# Patient Record
Sex: Male | Born: 1965 | Race: White | Hispanic: No | Marital: Married | Smoking: Former smoker
Health system: Southern US, Community
[De-identification: ages and names within clinical notes are randomized; demographics above are authoritative.]

## PROBLEM LIST (undated history)

## (undated) DIAGNOSIS — I10 Essential (primary) hypertension: Secondary | ICD-10-CM

## (undated) DIAGNOSIS — K5792 Diverticulitis of intestine, part unspecified, without perforation or abscess without bleeding: Secondary | ICD-10-CM

## (undated) DIAGNOSIS — G4730 Sleep apnea, unspecified: Secondary | ICD-10-CM

## (undated) HISTORY — PX: ANKLE SURGERY: SHX546

## (undated) HISTORY — DX: Diverticulitis of intestine, part unspecified, without perforation or abscess without bleeding: K57.92

## (undated) HISTORY — PX: JOINT REPLACEMENT: SHX530

## (undated) HISTORY — DX: Sleep apnea, unspecified: G47.30

## (undated) HISTORY — PX: HIP SURGERY: SHX245

---

## 2014-11-19 DIAGNOSIS — Z720 Tobacco use: Secondary | ICD-10-CM | POA: Insufficient documentation

## 2014-12-24 DIAGNOSIS — I3139 Other pericardial effusion (noninflammatory): Secondary | ICD-10-CM | POA: Insufficient documentation

## 2014-12-24 DIAGNOSIS — I10 Essential (primary) hypertension: Secondary | ICD-10-CM | POA: Insufficient documentation

## 2015-09-15 DIAGNOSIS — G4730 Sleep apnea, unspecified: Secondary | ICD-10-CM | POA: Insufficient documentation

## 2015-09-22 DIAGNOSIS — Z96649 Presence of unspecified artificial hip joint: Secondary | ICD-10-CM | POA: Insufficient documentation

## 2016-09-26 DIAGNOSIS — S8992XA Unspecified injury of left lower leg, initial encounter: Secondary | ICD-10-CM | POA: Insufficient documentation

## 2016-10-16 ENCOUNTER — Emergency Department
Admission: EM | Admit: 2016-10-16 | Discharge: 2016-10-17 | Disposition: A | Discharge status: Home / Self Care | Payer: BLUE CROSS/BLUE SHIELD | Attending: Emergency Medicine | Admitting: Emergency Medicine

## 2016-10-16 ENCOUNTER — Emergency Department: Payer: BLUE CROSS/BLUE SHIELD

## 2016-10-16 ENCOUNTER — Encounter: Payer: Self-pay | Admitting: Emergency Medicine

## 2016-10-16 DIAGNOSIS — W010XXA Fall on same level from slipping, tripping and stumbling without subsequent striking against object, initial encounter: Secondary | ICD-10-CM | POA: Insufficient documentation

## 2016-10-16 DIAGNOSIS — Y929 Unspecified place or not applicable: Secondary | ICD-10-CM | POA: Diagnosis not present

## 2016-10-16 DIAGNOSIS — I10 Essential (primary) hypertension: Secondary | ICD-10-CM | POA: Insufficient documentation

## 2016-10-16 DIAGNOSIS — S82302A Unspecified fracture of lower end of left tibia, initial encounter for closed fracture: Secondary | ICD-10-CM | POA: Insufficient documentation

## 2016-10-16 DIAGNOSIS — Y999 Unspecified external cause status: Secondary | ICD-10-CM | POA: Insufficient documentation

## 2016-10-16 DIAGNOSIS — S82891A Other fracture of right lower leg, initial encounter for closed fracture: Secondary | ICD-10-CM

## 2016-10-16 DIAGNOSIS — Y939 Activity, unspecified: Secondary | ICD-10-CM | POA: Diagnosis not present

## 2016-10-16 DIAGNOSIS — S99911A Unspecified injury of right ankle, initial encounter: Secondary | ICD-10-CM | POA: Diagnosis present

## 2016-10-16 HISTORY — DX: Essential (primary) hypertension: I10

## 2016-10-16 MED ORDER — HYDROMORPHONE HCL 1 MG/ML IJ SOLN: 1.0000 mg | Freq: Once | INTRAMUSCULAR | Status: DC

## 2016-10-16 MED ORDER — HYDROMORPHONE HCL 1 MG/ML PO LIQD
2.0000 mg | Freq: Once | ORAL | Status: DC
Start: 2016-10-16 — End: 2016-10-16

## 2016-10-16 MED ORDER — HYDROMORPHONE HCL 1 MG/ML IJ SOLN
INTRAMUSCULAR | Status: AC
  Administered 2016-10-16: 1 mg via INTRAVENOUS
  Filled 2016-10-16: qty 2

## 2016-10-16 MED ORDER — HYDROMORPHONE HCL 1 MG/ML IJ SOLN
1.0000 mg | Freq: Once | INTRAMUSCULAR | Status: AC
  Administered 2016-10-16: 1 mg via INTRAVENOUS

## 2016-10-16 MED ORDER — OXYCODONE-ACETAMINOPHEN 7.5-325 MG PO TABS: 1.0000 | ORAL_TABLET | ORAL | 0 refills | Status: DC | PRN

## 2016-10-16 NOTE — ED Notes (Signed)
Pt last had food at 1930 and liquids at 2100.

## 2016-10-16 NOTE — ED Notes (Signed)
Upon patient getting up to use crutches pt placed weight on splinted foot, pt states he felt a pop and splint looks crooked at this time.  Dr. Lamont Snowballifenbark notified.  Family also states that there is a bruising area at back of head.  Pt states he may have hit the back of his head.  Pt denies getting CT of head upon arrival to ER.  EDP to come and evaluate patient.

## 2016-10-16 NOTE — ED Notes (Addendum)
MD Mayford KnifeWilliams and ED tech at bedside for splint, pt medicated prior per MD.
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
## 2016-10-16 NOTE — ED Provider Notes (Signed)
Patient was seen and examined by me, he is neurovascularly intact. I did remove the splint placed prehospital and placed him in a sugar tong as well as posterior splint. X-rays were reviewed by orthopedics. Overall he is cleared for outpatient follow-up   Emily FilbertWilliams, Jonathan E, MD 10/16/16 2300

## 2016-10-16 NOTE — ED Notes (Addendum)
MD Loralie Champagneurrani , Ortho at bedside

## 2016-10-16 NOTE — ED Provider Notes (Signed)
I was called by nursing that the patient redislocated his ankle when attempting to ambulate with crutches after discharge.  I took down his dressing and noted the ankle displaced laterally with obvious medial tenting. I then reduced the patient's ankle fracture dislocation and placed him in a new splint short leg posterior with stirrup and repeat imaging was obtained which confirmed adequate reduction. The patient remains stable for discharge.   Reduction of dislocation Date/Time: 11:57 PM Performed by: Merrily BrittleNeil Keo Schirmer Authorized by: Merrily BrittleNeil Sharnell Knight Consent: Verbal consent obtained. Risks and benefits: risks, benefits and alternatives were discussed Consent given by: patient Required items: required blood products, implants, devices, and special equipment available Time out: Immediately prior to procedure a "time out" was called to verify the correct patient, procedure, equipment, support staff and site/side marked as required.   Vitals: Vital signs were monitored during sedation. Patient tolerance: Patient tolerated the procedure well with no immediate complications. Joint: Right ankle fracture dislocation Reduction technique: Traction countertraction   SPLINT APPLICATION Date/Time: 11:58 PM Authorized by: Merrily BrittleNeil Deo Mehringer Consent: Verbal consent obtained. Risks and benefits: risks, benefits and alternatives were discussed Consent given by: patient Splint applied by: ER technician Location details: Right ankle  Splint type: Short-leg posterior and stirrup  Supplies used: Ortho-Glass  Post-procedure: The splinted body part was neurovascularly unchanged following the procedure. Patient tolerance: Patient tolerated the procedure well with no immediate complications.       Merrily Brittleifenbark, Carmie Lanpher, MD 10/16/16 2358

## 2016-10-17 ENCOUNTER — Emergency Department: Payer: BLUE CROSS/BLUE SHIELD

## 2016-10-17 NOTE — ED Notes (Signed)
Pt discharged to home.  Family member driving.  Discharge instructions reviewed.  Verbalized understanding.  No questions or concerns at this time.  Teach back verified.  Pt in NAD.  No items left in ED.  Pt opted to use home walker instead of crutches due to recent hip replacement.  Pt ambulated well with hospital walker, but denied needing to take one home.

## 2016-10-27 ENCOUNTER — Emergency Department
Admission: EM | Admit: 2016-10-27 | Discharge: 2016-10-27 | Disposition: A | Discharge status: Home / Self Care | Payer: BLUE CROSS/BLUE SHIELD | Attending: Emergency Medicine | Admitting: Emergency Medicine

## 2016-10-27 DIAGNOSIS — G8918 Other acute postprocedural pain: Secondary | ICD-10-CM | POA: Diagnosis not present

## 2016-10-27 DIAGNOSIS — I10 Essential (primary) hypertension: Secondary | ICD-10-CM | POA: Insufficient documentation
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
## 2017-03-22 ENCOUNTER — Ambulatory Visit: Payer: BLUE CROSS/BLUE SHIELD

## 2017-03-22 DIAGNOSIS — G4733 Obstructive sleep apnea (adult) (pediatric): Secondary | ICD-10-CM

## 2017-03-22 NOTE — Progress Notes (Signed)
95 percentile pressure 12.5   95th percentile leak 8.8   apnea index 1.9 /hr  apnea-hypopnea index  2.3 /hr   total days used  >4 hr 178 days  total days used <4 hr 2 days  Total compliance 99 percent  [name] is doing great, no problems or questions at this time.

## 2017-09-20 ENCOUNTER — Ambulatory Visit: Payer: BLUE CROSS/BLUE SHIELD

## 2017-09-20 DIAGNOSIS — G4733 Obstructive sleep apnea (adult) (pediatric): Secondary | ICD-10-CM | POA: Diagnosis not present

## 2017-09-20 NOTE — Progress Notes (Signed)
95 percentile pressure 13.4   95th percentile leak 3.4   apnea index 0.8 /hr  apnea-hypopnea index  0.9 /hr   total days used  >4 hr 180 days  total days used <4 hr 0 days  Total compliance 100 percent  [name] is doing great, no problems or questions

## 2017-10-12 ENCOUNTER — Ambulatory Visit: Payer: BLUE CROSS/BLUE SHIELD | Admitting: Internal Medicine

## 2017-10-12 ENCOUNTER — Encounter: Payer: Self-pay | Admitting: Internal Medicine

## 2017-10-12 VITALS — BP 158/92 | HR 93 | Resp 16 | Ht 79.0 in | Wt 348.0 lb

## 2017-10-12 DIAGNOSIS — G4733 Obstructive sleep apnea (adult) (pediatric): Secondary | ICD-10-CM | POA: Diagnosis not present

## 2017-10-12 DIAGNOSIS — F17219 Nicotine dependence, cigarettes, with unspecified nicotine-induced disorders: Secondary | ICD-10-CM | POA: Diagnosis not present

## 2017-10-12 DIAGNOSIS — I10 Essential (primary) hypertension: Secondary | ICD-10-CM

## 2017-10-12 NOTE — Patient Instructions (Signed)
Coping with Quitting Smoking Quitting smoking is a physical and mental challenge. You will face cravings, withdrawal symptoms, and temptation. Before quitting, work with your health care provider to make a plan that can help you cope. Preparation can help you quit and keep you from giving in. How can I cope with cravings? Cravings usually last for 5-10 minutes. If you get through it, the craving will pass. Consider taking the following actions to help you cope with cravings:  Keep your mouth busy: ? Chew sugar-free gum. ? Suck on hard candies or a straw. ? Brush your teeth.  Keep your hands and body busy: ? Immediately change to a different activity when you feel a craving. ? Squeeze or play with a ball. ? Do an activity or a hobby, like making bead jewelry, practicing needlepoint, or working with wood. ? Mix up your normal routine. ? Take a short exercise break. Go for a quick walk or run up and down stairs. ? Spend time in public places where smoking is not allowed.  Focus on doing something kind or helpful for someone else.  Call a friend or family member to talk during a craving.  Join a support group.  Call a quit line, such as 1-800-QUIT-NOW.  Talk with your health care provider about medicines that might help you cope with cravings and make quitting easier for you.  How can I deal with withdrawal symptoms? Your body may experience negative effects as it tries to get used to not having nicotine in the system. These effects are called withdrawal symptoms. They may include:  Feeling hungrier than normal.  Trouble concentrating.  Irritability.  Trouble sleeping.  Feeling depressed.  Restlessness and agitation.  Craving a cigarette.  To manage withdrawal symptoms:  Avoid places, people, and activities that trigger your cravings.  Remember why you want to quit.  Get plenty of sleep.  Avoid coffee and other caffeinated drinks. These may worsen some of your  symptoms.  How can I handle social situations? Social situations can be difficult when you are quitting smoking, especially in the first few weeks. To manage this, you can:  Avoid parties, bars, and other social situations where people might be smoking.  Avoid alcohol.  Leave right away if you have the urge to smoke.  Explain to your family and friends that you are quitting smoking. Ask for understanding and support.  Plan activities with friends or family where smoking is not an option.  What are some ways I can cope with stress? Wanting to smoke may cause stress, and stress can make you want to smoke. Find ways to manage your stress. Relaxation techniques can help. For example:  Breathe slowly and deeply, in through your nose and out through your mouth.  Listen to soothing, relaxing music.  Talk with a family member or friend about your stress.  Light a candle.  Soak in a bath or take a shower.  Think about a peaceful place.  What are some ways I can prevent weight gain? Be aware that many people gain weight after they quit smoking. However, not everyone does. To keep from gaining weight, have a plan in place before you quit and stick to the plan after you quit. Your plan should include:  Having healthy snacks. When you have a craving, it may help to: ? Eat plain popcorn, crunchy carrots, celery, or other cut vegetables. ? Chew sugar-free gum.  Changing how you eat: ? Eat small portion sizes at meals. ?   Eat 4-6 small meals throughout the day instead of 1-2 large meals a day. ? Be mindful when you eat. Do not watch television or do other things that might distract you as you eat.  Exercising regularly: ? Make time to exercise each day. If you do not have time for a long workout, do short bouts of exercise for 5-10 minutes several times a day. ? Do some form of strengthening exercise, like weight lifting, and some form of aerobic exercise, like running or  swimming.  Drinking plenty of water or other low-calorie or no-calorie drinks. Drink 6-8 glasses of water daily, or as much as instructed by your health care provider.  Summary  Quitting smoking is a physical and mental challenge. You will face cravings, withdrawal symptoms, and temptation to smoke again. Preparation can help you as you go through these challenges.  You can cope with cravings by keeping your mouth busy (such as by chewing gum), keeping your body and hands busy, and making calls to family, friends, or a helpline for people who want to quit smoking.  You can cope with withdrawal symptoms by avoiding places where people smoke, avoiding drinks with caffeine, and getting plenty of rest.  Ask your health care provider about the different ways to prevent weight gain, avoid stress, and handle social situations. This information is not intended to replace advice given to you by your health care provider. Make sure you discuss any questions you have with your health care provider. Document Released: 01/29/2016 Document Revised: 01/29/2016 Document Reviewed: 01/29/2016 Elsevier Interactive Patient Education  2018 [employer] Inc. Sleep Apnea Sleep apnea is a condition that affects breathing. People with sleep apnea have moments during sleep when their breathing pauses briefly or gets shallow. Sleep apnea can cause these symptoms:  Trouble staying asleep.  Sleepiness or tiredness during the day.  Irritability.  Loud snoring.  Morning headaches.  Trouble concentrating.  Forgetting things.  Less interest in sex.  Being sleepy for no reason.  Mood swings.  Personality changes.  Depression.  Waking up a lot during the night to pee (urinate).  Dry mouth.  Sore throat.  Follow these instructions at home:  Make any changes in your routine that your doctor recommends.  Eat a healthy, well-balanced diet.  Take over-the-counter and prescription medicines only as told  by your doctor.  Avoid using alcohol, calming medicines (sedatives), and narcotic medicines.  Take steps to lose weight if you are overweight.  If you were given a machine (device) to use while you sleep, use it only as told by your doctor.  Do not use any tobacco products, such as cigarettes, chewing tobacco, and e-cigarettes. If you need help quitting, ask your doctor.  Keep all follow-up visits as told by your doctor. This is important. Contact a doctor if:  The machine that you were given to use during sleep is uncomfortable or does not seem to be working.  Your symptoms do not get better.  Your symptoms get worse. Get help right away if:  Your chest hurts.  You have trouble breathing in enough air (shortness of breath).  You have an uncomfortable feeling in your back, arms, or stomach.  You have trouble talking.  One side of your body feels weak.  A part of your face is hanging down (drooping). These symptoms may be an emergency. Do not wait to see if the symptoms will go away. Get medical help right away. Call your local emergency services (911 in the [place]  Do not drive yourself to the hospital. This information is not intended to replace advice given to you by your health care provider. Make sure you discuss any questions you have with your health care provider. Document Released: 11/10/2007 Document Revised: 09/27/2015 Document Reviewed: 11/10/2014 Elsevier Interactive Patient Education  [employer]2018 [employer]

## 2017-10-12 NOTE — Progress Notes (Signed)
[facility] [address] [place], [place] [address]  Pulmonary Sleep Medicine   Office Visit Note  Patient Name: Trevor Castillo DOB: 1965-05-14 MRN [id number]  Date of Service: 10/12/2017  Complaints/HPI: Pt is here for one year follow up for OSA.  He reports using his CPAP every night.  He denies mask leak and reports good fit.  He is sleeping approximately 7-8 hours nightly. He denies waking up during the night.  He denies chest pain, headaches, sinus issues. He continues to smoke cigarettes.  He smokes 1 Pack every 3 days.  Pt uses so clean device to clean is cpap.   ROS  General: (-) fever, (-) chills, (-) night sweats, (-) weakness Skin: (-) rashes, (-) itching,. Eyes: (-) visual changes, (-) redness, (-) itching. Nose and Sinuses: (-) nasal stuffiness or itchiness, (-) postnasal drip, (-) nosebleeds, (-) sinus trouble. Mouth and Throat: (-) sore throat, (-) hoarseness. Neck: (-) swollen glands, (-) enlarged thyroid, (-) neck pain. Respiratory: - cough, (-) bloody sputum, - shortness of breath, - wheezing. Cardiovascular: - ankle swelling, (-) chest pain. Lymphatic: (-) lymph node enlargement. Neurologic: (-) numbness, (-) tingling. Psychiatric: (-) anxiety, (-) depression   Current Medication: Outpatient Encounter Medications as of 10/12/2017  Medication Sig  . LORazepam (ATIVAN) 0.5 MG tablet Take by mouth.  . losartan-hydrochlorothiazide (HYZAAR) 100-12.5 MG tablet Take by mouth.  . [DISCONTINUED] oxyCODONE-acetaminophen (PERCOCET) 7.5-325 MG tablet Take 1 tablet by mouth every 4 (four) hours as needed for severe pain. (Patient not taking: Reported on 10/12/2017)   No facility-administered encounter medications on file as of 10/12/2017.     Surgical History: Past Surgical History:  Procedure Laterality Date  . ANKLE SURGERY Right   . HIP SURGERY      Medical History: Past Medical History:  Diagnosis Date  . Hypertension   . Sleep apnea      Family History: Family History  Problem Relation Age of Onset  . Diabetes Mother     Social History: Social History   Socioeconomic History  . Marital status: Married    Spouse name: Not on file  . Number of children: Not on file  . Years of education: Not on file  . Highest education level: Not on file  Occupational History  . Not on file  Social Needs  . Financial resource strain: Not on file  . Food insecurity:    Worry: Not on file    Inability: Not on file  . Transportation needs:    Medical: Not on file    Non-medical: Not on file  Tobacco Use  . Smoking status: Current Every Day Smoker    Types: Cigarettes  . Smokeless tobacco: Never Used  Substance and Sexual Activity  . Alcohol use: Yes  . Drug use: No  . Sexual activity: Not on file  Lifestyle  . Physical activity:    Days per week: Not on file    Minutes per session: Not on file  . Stress: Not on file  Relationships  . Social connections:    Talks on phone: Not on file    Gets together: Not on file    Attends religious service: Not on file    Active member of club or organization: Not on file    Attends meetings of clubs or organizations: Not on file    Relationship status: Not on file  . Intimate partner violence:    Fear of current or ex partner: Not on file  Emotionally abused: Not on file    Physically abused: Not on file    Forced sexual activity: Not on file  Other Topics Concern  . Not on file  Social History Narrative  . Not on file    Vital Signs: Blood pressure (!) 158/92, pulse 93, resp. rate 16, height 6\' 7"  (2.007 m), weight (!) 348 lb (157.9 kg), SpO2 98 %.  Examination: General Appearance: The patient is well-developed, well-nourished, and in no distress. Skin: Gross inspection of skin unremarkable. Head: normocephalic, no gross deformities. Eyes: no gross deformities noted. ENT: ears appear grossly normal no exudates. Neck: Supple. No thyromegaly. No  LAD. Respiratory: Clear to auscultation bilateraly. Cardiovascular: Normal S1 and S2 without murmur or rub. Extremities: No cyanosis. pulses are equal. Neurologic: Alert and oriented. No involuntary movements.  LABS: No results found for this or any previous visit (from the past 2160 hour(s)).  Radiology: No results found.  No results found.  No results found.    Assessment and Plan: Patient Active Problem List   Diagnosis Date Noted  . Left knee injury, initial encounter 09/26/2016  . 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 Essential hypertension 12/24/2014    1. Obstructive sleep apnea of adult Continue cpap compliance.   2. Essential hypertension Elevated at today's visit.  Pt thinks he forgot his medication this morning.  Encouraged medication compliance.     3. Cigarette nicotine dependence with nicotine-induced disorder Again discussed smoking cessation with him.   Smoking cessation counseling: 1. Pt acknowledges the risks of long term smoking, she will try to quite smoking. 2. Options for different medications including nicotine products, chewing gum, patch etc, Wellbutrin and Chantix is discussed 3. Goal and date of compete cessation is discussed 4. Total time spent in smoking cessation is 15 min.   4. Morbid obesity (HCC) Obesity Counseling: Risk Assessment: An assessment of behavioral risk factors was made today and includes lack of exercise sedentary lifestyle, lack of portion control and poor dietary habits.  Risk Modification Advice: She was counseled on portion control guidelines. Restricting daily caloric intake to. . The detrimental long term effects of obesity on her health and ongoing poor compliance was also discussed with the patient.    General Counseling: I have discussed the findings of the evaluation and examination with Trevor Maduroobert.  I have also discussed any further diagnostic evaluation thatmay be needed or ordered today.  Trevor Castillo verbalizes understanding of the findings of todays visit. We also reviewed his medications today and discussed drug interactions and side effects including but not limited excessive drowsiness and altered mental states. We also discussed that there is always a risk not just to him but also people around him. he has been encouraged to call the office with any questions or concerns that should arise related to todays visit.    Time spent: 25 This patient was seen by Blima LedgerAdam Gola Bribiesca AGNP-C in Collaboration with Dr. Freda MunroSaadat Khan as a part of collaborative care agreement.   I have personally obtained a history, examined the patient, evaluated laboratory and imaging results, formulated the assessment and plan and placed orders.    Yevonne PaxSaadat A Khan, MD [facility] Pulmonary and Critical Care Sleep medicine

## 2018-03-28 ENCOUNTER — Ambulatory Visit: Payer: Self-pay

## 2018-04-11 ENCOUNTER — Ambulatory Visit: Payer: BC Managed Care – PPO

## 2018-04-11 DIAGNOSIS — G4733 Obstructive sleep apnea (adult) (pediatric): Secondary | ICD-10-CM

## 2018-04-11 NOTE — Progress Notes (Signed)
95 percentile pressure 14.2   95th percentile leak 8.9   apnea index 1.5 /hr  apnea-hypopnea index  1.8 /hr   total days used  >4 hr 90 days  total days used <4 hr 0 days  Total compliance 100 percent  He does great on cpap no problems or questions at this time

## 2018-10-10 ENCOUNTER — Ambulatory Visit: Payer: BC Managed Care – PPO

## 2018-10-10 DIAGNOSIS — G4733 Obstructive sleep apnea (adult) (pediatric): Secondary | ICD-10-CM | POA: Insufficient documentation

## 2018-10-10 NOTE — Progress Notes (Signed)
95 percentile pressure 13.6   95th percentile leak 7.4   apnea index 1.9 /hr  apnea-hypopnea index  2.2 /hr   total days used  >4 hr 19 days  total days used <4 hr 0 days  Total compliance 100 percent  He is doing great no problems or questions

## 2018-10-15 ENCOUNTER — Other Ambulatory Visit: Payer: Self-pay

## 2018-10-15 ENCOUNTER — Ambulatory Visit: Payer: BC Managed Care – PPO | Admitting: Internal Medicine

## 2018-10-15 ENCOUNTER — Encounter: Payer: Self-pay | Admitting: Internal Medicine

## 2018-10-15 VITALS — BP 144/89 | HR 95 | Resp 16 | Ht 79.0 in | Wt 351.0 lb

## 2018-10-15 DIAGNOSIS — G4733 Obstructive sleep apnea (adult) (pediatric): Secondary | ICD-10-CM

## 2018-10-15 DIAGNOSIS — I10 Essential (primary) hypertension: Secondary | ICD-10-CM

## 2018-10-15 DIAGNOSIS — F17219 Nicotine dependence, cigarettes, with unspecified nicotine-induced disorders: Secondary | ICD-10-CM | POA: Diagnosis not present

## 2018-10-15 NOTE — Progress Notes (Signed)
[facility] [place], [place] [address]  Pulmonary Sleep Medicine   Office Visit Note  Patient Name: Trevor Castillo DOB: [birth year] MRN [id number]  Date of Service: 10/15/2018  Complaints/HPI: Pt is here for follow up on osa.  He reports he is wearing his cpap nightly, and his last compliance report shows 100% compliance with an ahi of 2.2/hr.  He denies snoring, and reports sleeping very well.  He denies any daytime fatigue and overall is doing well.   ROS  General: (-) fever, (-) chills, (-) night sweats, (-) weakness Skin: (-) rashes, (-) itching,. Eyes: (-) visual changes, (-) redness, (-) itching. Nose and Sinuses: (-) nasal stuffiness or itchiness, (-) postnasal drip, (-) nosebleeds, (-) sinus trouble. Mouth and Throat: (-) sore throat, (-) hoarseness. Neck: (-) swollen glands, (-) enlarged thyroid, (-) neck pain. Respiratory: - cough, (-) bloody sputum, - shortness of breath, - wheezing. Cardiovascular: - ankle swelling, (-) chest pain. Lymphatic: (-) lymph node enlargement. Neurologic: (-) numbness, (-) tingling. Psychiatric: (-) anxiety, (-) depression   Current Medication: Outpatient Encounter Medications as of 10/15/2018  Medication Sig  . LORazepam (ATIVAN) 0.5 MG tablet Take by mouth.  . losartan-hydrochlorothiazide (HYZAAR) 100-12.5 MG tablet Take by mouth.  . Misc. Devices MISC by Does not apply route. cpap device  . [DISCONTINUED] losartan-hydrochlorothiazide (HYZAAR) 100-12.5 MG tablet Take by mouth.   No facility-administered encounter medications on file as of 10/15/2018.     Surgical History: Past Surgical History:  Procedure Laterality Date  . ANKLE SURGERY Right   . HIP SURGERY      Medical History: Past Medical History:  Diagnosis Date  . Hypertension   . Sleep apnea     Family History: Family History  Problem Relation Age of Onset  . Diabetes Mother     Social History: Social History   Socioeconomic  History  . Marital status: Married    Spouse name: Not on file  . Number of children: Not on file  . Years of education: Not on file  . Highest education level: Not on file  Occupational History  . Not on file  Social Needs  . Financial resource strain: Not on file  . Food insecurity    Worry: Not on file    Inability: Not on file  . Transportation needs    Medical: Not on file    Non-medical: Not on file  Tobacco Use  . Smoking status: Former Smoker    Types: Cigarettes    Quit date: 05/15/2018    Years since quitting: 0.4  . Smokeless tobacco: Never Used  Substance and Sexual Activity  . Alcohol use: Yes  . Drug use: No  . Sexual activity: Not on file  Lifestyle  . Physical activity    Days per week: Not on file    Minutes per session: Not on file  . Stress: Not on file  Relationships  . Social Herbalist on phone: Not on file    Gets together: Not on file    Attends religious service: Not on file    Active member of club or organization: Not on file    Attends meetings of clubs or organizations: Not on file    Relationship status: Not on file  . Intimate partner violence    Fear of current or ex partner: Not on file    Emotionally abused: Not on file    Physically abused: Not on file  Forced sexual activity: Not on file  Other Topics Concern  . Not on file  Social History Narrative  . Not on file    Vital Signs: Blood pressure (!) 144/89, pulse 95, resp. rate 16, height 6\' 7"  (2.007 m), weight (!) 351 lb (159.2 kg), SpO2 98 %.  Examination: General Appearance: The patient is well-developed, well-nourished, and in no distress. Skin: Gross inspection of skin unremarkable. Head: normocephalic, no gross deformities. Eyes: no gross deformities noted. ENT: ears appear grossly normal no exudates. Neck: Supple. No thyromegaly. No LAD. Respiratory: clear bilaterlly. Cardiovascular: Normal S1 and S2 without murmur or rub. Extremities: No cyanosis.  pulses are equal. Neurologic: Alert and oriented. No involuntary movements.  LABS: No results found for this or any previous visit (from the past 2160 hour(s)).  Radiology: No results found.  No results found.  No results found.    Assessment and Plan: Patient Active Problem List   Diagnosis Date Noted  . Obstructive sleep apnea (adult)   10/10/2018  . Left knee injury, initial encounter 09/26/2016  . 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 Essential hypertension 12/24/2014    1. Obstructive sleep apnea (adult)   Excellent compliance and control of symptoms.  Continue present management and using cpap device.   2. Essential hypertension Slightly elevated today, 144/89, encouraged patient to discuss with pcp.   3. Cigarette nicotine dependence with nicotine-induced disorder Smoking cessation counseling: 1. Pt acknowledges the risks of long term smoking, she will try to quite smoking. 2. Options for different medications including nicotine products, chewing gum, patch etc, Wellbutrin and Chantix is discussed 3. Goal and date of compete cessation is discussed 4. Total time spent in smoking cessation is 15 min.  4. Morbid obesity (HCC) Obesity Counseling: Risk Assessment: An assessment of behavioral risk factors was made today and includes lack of exercise sedentary lifestyle, lack of portion control and poor dietary habits.  Risk Modification Advice: She was counseled on portion control guidelines. Restricting daily caloric intake to. . The detrimental long term effects of obesity on her health and ongoing poor compliance was also discussed with the patient.    General Counseling: I have discussed the findings of the evaluation and examination with Trevor Castillo.  I have also discussed any further diagnostic evaluation thatmay be needed or ordered today. Trevor Castillo verbalizes understanding of the findings of todays visit. We also reviewed his medications today and  discussed drug interactions and side effects including but not limited excessive drowsiness and altered mental states. We also discussed that there is always a risk not just to him but also people around him. he has been encouraged to call the office with any questions or concerns that should arise related to todays visit.    Time spent: 15 This patient was seen by Blima LedgerAdam Goldman Birchall AGNP-C in Collaboration with Dr. Freda MunroSaadat Khan as a part of collaborative care agreement.   I have personally obtained a history, examined the patient, evaluated laboratory and imaging results, formulated the assessment and plan and placed orders.    Yevonne PaxSaadat A Khan, MD [facility] Pulmonary and Critical Care Sleep medicine

## 2019-04-16 ENCOUNTER — Telehealth: Payer: Self-pay

## 2019-04-16 NOTE — Telephone Encounter (Signed)
Confirmed appointment on 04/17/2019 and screened for covid. [facility] 

## 2019-04-17 ENCOUNTER — Ambulatory Visit: Payer: BC Managed Care – PPO

## 2019-04-17 ENCOUNTER — Other Ambulatory Visit: Payer: Self-pay

## 2019-04-17 DIAGNOSIS — G4733 Obstructive sleep apnea (adult) (pediatric): Secondary | ICD-10-CM

## 2019-04-17 NOTE — Progress Notes (Signed)
95 percentile pressure 13.4   95th percentile leak 13.2   apnea index 1.5 /hr  apnea-hypopnea index  1.7 /hr   total days used  >4 hr 88 days  total days used <4 hr 0 days  Total compliance 98 percent  He is doing great no problems or questions

## 2019-05-06 DIAGNOSIS — E7800 Pure hypercholesterolemia, unspecified: Secondary | ICD-10-CM | POA: Diagnosis not present

## 2019-05-06 DIAGNOSIS — G4733 Obstructive sleep apnea (adult) (pediatric): Secondary | ICD-10-CM | POA: Diagnosis not present

## 2019-05-06 DIAGNOSIS — Z0000 Encounter for general adult medical examination without abnormal findings: Secondary | ICD-10-CM | POA: Diagnosis not present

## 2019-05-06 DIAGNOSIS — F411 Generalized anxiety disorder: Secondary | ICD-10-CM | POA: Diagnosis not present

## 2019-05-06 DIAGNOSIS — Z125 Encounter for screening for malignant neoplasm of prostate: Secondary | ICD-10-CM | POA: Diagnosis not present

## 2019-05-06 DIAGNOSIS — I10 Essential (primary) hypertension: Secondary | ICD-10-CM | POA: Diagnosis not present

## 2019-06-13 DIAGNOSIS — L578 Other skin changes due to chronic exposure to nonionizing radiation: Secondary | ICD-10-CM | POA: Diagnosis not present

## 2019-06-13 DIAGNOSIS — L570 Actinic keratosis: Secondary | ICD-10-CM | POA: Diagnosis not present

## 2019-06-13 DIAGNOSIS — B360 Pityriasis versicolor: Secondary | ICD-10-CM | POA: Diagnosis not present

## 2019-06-13 DIAGNOSIS — L853 Xerosis cutis: Secondary | ICD-10-CM | POA: Diagnosis not present

## 2019-09-18 ENCOUNTER — Ambulatory Visit: Payer: BC Managed Care – PPO

## 2019-09-18 ENCOUNTER — Other Ambulatory Visit: Payer: Self-pay

## 2019-09-18 DIAGNOSIS — G4733 Obstructive sleep apnea (adult) (pediatric): Secondary | ICD-10-CM

## 2019-09-18 NOTE — Progress Notes (Signed)
95 percentile pressure 14.1   95th percentile leak 13.8   apnea index 1.4 /hr  apnea-hypopnea index  1.6 /hr   total days used  >4 hr 90 days  total days used <4 hr 0 days  Total compliance 100 percent  He is doing great no problems or questions at this time

## 2019-10-03 ENCOUNTER — Ambulatory Visit
Admission: EM | Admit: 2019-10-03 | Discharge: 2019-10-03 | Disposition: A | Discharge status: Home / Self Care | Payer: BC Managed Care – PPO | Attending: Emergency Medicine | Admitting: Emergency Medicine

## 2019-10-03 ENCOUNTER — Other Ambulatory Visit: Payer: Self-pay

## 2019-10-03 DIAGNOSIS — H6691 Otitis media, unspecified, right ear: Secondary | ICD-10-CM | POA: Diagnosis not present

## 2019-10-03 MED ORDER — AMOXICILLIN 875 MG PO TABS: 875.0000 mg | ORAL_TABLET | Freq: Two times a day (BID) | ORAL | 0 refills | Status: DC

## 2019-10-03 NOTE — ED Provider Notes (Signed)
Trevor Castillo    CSN: [id number] Arrival date & time: 10/03/19  1430      History   Chief Complaint Chief Complaint  Patient presents with  . Otalgia    right    HPI Trevor Castillo is a 54 y.o. male.   Patient presents with right ear pain x2 days.  He denies drainage, fever, chills, sore throat, shortness of breath, vomiting, diarrhea, or other symptoms.  Treatment attempted at home with Aleve.  The history is provided by the patient.    Past Medical History:  Diagnosis Date  . Hypertension   . Sleep apnea     Patient Active Problem List   Diagnosis Date Noted  . Obstructive sleep apnea (adult)   10/10/2018  . Left knee injury, initial encounter 09/26/2016  . 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 Essential hypertension 12/24/2014    Past Surgical History:  Procedure Laterality Date  . ANKLE SURGERY Right   . HIP SURGERY         Home Medications    Prior to Admission medications   Medication Sig Start Date End Date Taking? Authorizing Provider  LORazepam (ATIVAN) 0.5 MG tablet Take by mouth. 07/26/17  Yes [provider]  losartan-hydrochlorothiazide (HYZAAR) 100-12.5 MG tablet Take by mouth. 04/27/18 10/03/19 Yes [provider]  Misc. Devices MISC by Does not apply route. cpap device   Yes [provider]  amoxicillin (AMOXIL) 875 MG tablet Take 1 tablet (875 mg total) by mouth 2 (two) times daily for 7 days. 10/03/19 10/10/19  Mickie Bail, NP    Family History Family History  Problem Relation Age of Onset  . Diabetes Mother     Social History Social History   Tobacco Use  . Smoking status: Former Smoker    Types: Cigarettes    Quit date: 05/15/2018    Years since quitting: 1.3  . Smokeless tobacco: Never Used  Vaping Use  . Vaping Use: Every day  Substance Use Topics  . Alcohol use: Yes  . Drug use: No     Allergies   Patient has no known allergies.   Review of Systems Review  of Systems  Constitutional: Negative for chills and fever.  HENT: Positive for ear pain. Negative for sore throat.   Eyes: Negative for pain and visual disturbance.  Respiratory: Negative for cough and shortness of breath.   Cardiovascular: Negative for chest pain and palpitations.  Gastrointestinal: Negative for abdominal pain, diarrhea and vomiting.  Genitourinary: Negative for dysuria and hematuria.  Musculoskeletal: Negative for arthralgias and back pain.  Skin: Negative for color change and rash.  Neurological: Negative for seizures and syncope.  All other systems reviewed and are negative.    Physical Exam Triage Vital Signs ED Triage Vitals  Enc Vitals Group     BP      Pulse      Resp      Temp      Temp src      SpO2      Weight      Height      Head Circumference      Peak Flow      Pain Score      Pain Loc      Pain Edu?      Excl. in GC?    No data found.  Updated Vital Signs BP 131/90 (BP Location: Left Arm)   Pulse 92   Temp  99.5 F (37.5 C) (Oral)   Resp 18   Ht 6\' 7"  (2.007 m)   Wt (!) 360 lb (163.3 kg)   SpO2 97%   BMI 40.56 kg/m   Visual Acuity Right Eye Distance:   Left Eye Distance:   Bilateral Distance:    Right Eye Near:   Left Eye Near:    Bilateral Near:     Physical Exam Vitals and nursing note reviewed.  Constitutional:      General: He is not in acute distress.    Appearance: He is well-developed.  HENT:     Head: Normocephalic and atraumatic.     Right Ear: Ear canal normal. Tympanic membrane is erythematous.     Left Ear: Tympanic membrane and ear canal normal.     Nose: Nose normal.     Mouth/Throat:     Mouth: Mucous membranes are moist.     Pharynx: Oropharynx is clear.  Eyes:     Conjunctiva/sclera: Conjunctivae normal.  Cardiovascular:     Rate and Rhythm: Normal rate and regular rhythm.     Heart sounds: No murmur heard.   Pulmonary:     Effort: Pulmonary effort is normal. No respiratory distress.      Breath sounds: Normal breath sounds.  Abdominal:     Palpations: Abdomen is soft.     Tenderness: There is no abdominal tenderness. There is no guarding or rebound.  Musculoskeletal:     Cervical back: Neck supple.  Skin:    General: Skin is warm and dry.     Findings: No rash.  Neurological:     General: No focal deficit present.     Mental Status: He is alert and oriented to person, place, and time.     Gait: Gait normal.  Psychiatric:        Mood and Affect: Mood normal.        Behavior: Behavior normal.      UC Treatments / Results  Labs (all labs ordered are listed, but only abnormal results are displayed) Labs Reviewed - No data to display  EKG   Radiology No results found.  Procedures Procedures (including critical care time)  Medications Ordered in UC Medications - No data to display  Initial Impression / Assessment and Plan / UC Course  I have reviewed the triage vital signs and the nursing notes.  Pertinent labs & imaging results that were available during my care of the patient were reviewed by me and considered in my medical decision making (see chart for details).   Right otitis media.  Treating with amoxicillin.  Instructed patient to continue Tylenol or ibuprofen as needed for discomfort.  Instructed him to follow-up with his PCP if his symptoms are not improving.  Patient agrees to plan of care.   Final Clinical Impressions(s) / UC Diagnoses   Final diagnoses:  Right otitis media, unspecified otitis media type     Discharge Instructions     Take the amoxicillin as directed.    Follow up with your primary care provider if your symptoms are not improving.       ED Prescriptions    Medication Sig Dispense Auth. Provider   amoxicillin (AMOXIL) 875 MG tablet Take 1 tablet (875 mg total) by mouth 2 (two) times daily for 7 days. 14 tablet , NP     PDMP not reviewed this encounter.   Mickie Bail, NP 10/03/19 1458

## 2019-10-03 NOTE — ED Triage Notes (Signed)
Patient complains of right ear pain that started yesterday. Patient states that the pain was worse last night and woke him up. Concerned for swimmer's ear.

## 2019-10-03 NOTE — Discharge Instructions (Signed)
Take the amoxicillin as directed.  Follow up with your primary care provider if your symptoms are not improving.   ° ° °

## 2019-10-09 ENCOUNTER — Other Ambulatory Visit: Payer: Self-pay

## 2019-10-09 ENCOUNTER — Ambulatory Visit
Admission: EM | Admit: 2019-10-09 | Discharge: 2019-10-09 | Disposition: A | Discharge status: Home / Self Care | Payer: BC Managed Care – PPO

## 2019-10-09 DIAGNOSIS — H6591 Unspecified nonsuppurative otitis media, right ear: Secondary | ICD-10-CM

## 2019-10-09 DIAGNOSIS — H9201 Otalgia, right ear: Secondary | ICD-10-CM

## 2019-10-09 DIAGNOSIS — H60331 Swimmer's ear, right ear: Secondary | ICD-10-CM

## 2019-10-09 MED ORDER — AMOXICILLIN-POT CLAVULANATE 875-125 MG PO TABS: 1.0000 | ORAL_TABLET | Freq: Two times a day (BID) | ORAL | 0 refills | Status: AC

## 2019-10-09 MED ORDER — CIPROFLOXACIN-DEXAMETHASONE 0.3-0.1 % OT SUSP: 4.0000 [drp] | Freq: Two times a day (BID) | OTIC | 0 refills | Status: AC

## 2019-10-09 NOTE — ED Triage Notes (Signed)
Pt was eval'ed and tx for R ear infection at [facility] on 08/19.  Was on amoxicillin and improving.  Went swimming on Sat 08/21 and has had intermittent increased pain in R ear.

## 2019-10-09 NOTE — Discharge Instructions (Signed)
I have sent in Augmentin to your pharmacy  Take the antibiotic twice a day for 10 days  I have also sent in ciprodex drops to your pharmacy. 4 drops to the right ear twice a day for 7 days  Follow up with the ER for high fever, inconsolable crying, trouble swallowing, trouble breathing, decreased urine output, other concerning symptoms

## 2019-10-09 NOTE — ED Provider Notes (Signed)
[facility] CARE CENTER   [id number] 10/09/19 Arrival Time: 1003  CC: EAR PAIN  SUBJECTIVE: History from: patient.  Trevor Castillo is a 54 y.o. male who presents with right ear pain for the past week. Was seen in this UC on 10/03/19 and treated with amoxicillin for ear infection. Reports that he was feeling better, went swimming x 3 days ago and pain has returned.Patient states the pain is constant and achy in character. Symptoms are made worse with lying down. Reports similar symptoms in the past. Denies fever, chills, fatigue, sinus pain, rhinorrhea, ear discharge, sore throat, SOB, wheezing, chest pain, nausea, changes in bowel or bladder habits.    ROS: As per HPI.  All other pertinent ROS negative.     Past Medical History:  Diagnosis Date  . Hypertension   . Sleep apnea    Past Surgical History:  Procedure Laterality Date  . ANKLE SURGERY Right   . HIP SURGERY    . JOINT REPLACEMENT     No Known Allergies No current facility-administered medications on file prior to encounter.   Current Outpatient Medications on File Prior to Encounter  Medication Sig Dispense Refill  . LORazepam (ATIVAN) 0.5 MG tablet Take by mouth.    . losartan-hydrochlorothiazide (HYZAAR) 100-12.5 MG tablet Take by mouth.    . Misc. Devices MISC by Does not apply route. cpap device    . naproxen sodium (ALEVE) 220 MG tablet Take 220 mg by mouth.     Social History   Socioeconomic History  . Marital status: Married    Spouse name: Not on file  . Number of children: Not on file  . Years of education: Not on file  . Highest education level: Not on file  Occupational History  . Not on file  Tobacco Use  . Smoking status: Former Smoker    Types: Cigarettes    Quit date: 05/15/2018    Years since quitting: 1.4  . Smokeless tobacco: Never Used  Vaping Use  . Vaping Use: Every day  . Substances: Nicotine, Flavoring  Substance and Sexual Activity  . Alcohol use: Yes    Alcohol/week: 14.0  standard drinks    Types: 14 Shots of liquor per week  . Drug use: No  . Sexual activity: Not on file  Other Topics Concern  . Not on file  Social History Narrative  . Not on file   Social Determinants of Health   Financial Resource Strain:   . Difficulty of Paying Living Expenses: Not on file  Food Insecurity:   . Worried About Programme researcher, broadcasting/film/video in the Last Year: Not on file  . Ran Out of Food in the Last Year: Not on file  Transportation Needs:   . Lack of Transportation (Medical): Not on file  . Lack of Transportation (Non-Medical): Not on file  Physical Activity:   . Days of Exercise per Week: Not on file  . Minutes of Exercise per Session: Not on file  Stress:   . Feeling of Stress : Not on file  Social Connections:   . Frequency of Communication with Friends and Family: Not on file  . Frequency of Social Gatherings with Friends and Family: Not on file  . Attends Religious Services: Not on file  . Active Member of Clubs or Organizations: Not on file  . Attends Banker Meetings: Not on file  . Marital Status: Not on file  Intimate Partner Violence:   . Fear of Current or Ex-Partner:  Not on file  . Emotionally Abused: Not on file  . Physically Abused: Not on file  . Sexually Abused: Not on file   Family History  Problem Relation Age of Onset  . Diabetes Mother     OBJECTIVE:  Vitals:   10/09/19 1009  BP: (!) 141/93  Pulse: 92  Resp: 18  Temp: 98.8 F (37.1 C)  TempSrc: Oral  SpO2: 98%     General appearance: alert; appears fatigued HEENT: Ears: L EACs clear, R EAC with erythema, swelling,  Tenderness, L TM pearly gray with visible cone of light, without erythema, R TM erythematous, bulging, with effusion; Eyes: PERRL, EOMI grossly; Sinuses nontender to palpation; Nose: clear rhinorrhea; Throat: oropharynx mildly erythematous, tonsils 1+ without white tonsillar exudates, uvula midline Neck: supple without LAD Lungs: unlabored respirations,  symmetrical air entry; cough: absent; no respiratory distress Heart: regular rate and rhythm.  Radial pulses 2+ symmetrical bilaterally Skin: warm and dry Psychological: alert and cooperative; normal mood and affect  Imaging: No results found.   ASSESSMENT & PLAN:  1. Right otitis media with effusion   2. Otalgia, right   3. Acute swimmer's ear of right side     Meds ordered this encounter  Medications  . amoxicillin-clavulanate (AUGMENTIN) 875-125 MG tablet    Sig: Take 1 tablet by mouth 2 (two) times daily for 10 days.    Dispense:  20 tablet    Refill:  0    Order Specific Question:   Supervising Provider    Answer:   Merrilee Jansky [id number]  . ciprofloxacin-dexamethasone (CIPRODEX) OTIC suspension    Sig: Place 4 drops into the right ear 2 (two) times daily for 7 days.    Dispense:  7.5 mL    Refill:  0    Order Specific Question:   Supervising Provider    Answer:   Merrilee Jansky [[id number]]    Rest and drink plenty of fluids Prescribed augmentin 875 for 7 days Prescribed ciprodex ear drops Take medications as directed and to completion Continue to use OTC ibuprofen and/ or tylenol as needed for pain control Follow up with PCP if symptoms persists Return here or go to the ER if you have any new or worsening symptoms   Reviewed expectations re: course of current medical issues. Questions answered. Outlined signs and symptoms indicating need for more acute intervention. Patient verbalized understanding. After Visit Summary given.         Moshe Cipro, NP 10/09/19 1027

## 2019-10-10 ENCOUNTER — Telehealth: Payer: Self-pay

## 2019-10-10 NOTE — Telephone Encounter (Signed)
Confirmed appt for 10/14/19  

## 2019-10-14 ENCOUNTER — Other Ambulatory Visit: Payer: Self-pay

## 2019-10-14 ENCOUNTER — Encounter: Payer: Self-pay | Admitting: Internal Medicine

## 2019-10-14 ENCOUNTER — Ambulatory Visit: Payer: BC Managed Care – PPO | Admitting: Internal Medicine

## 2019-10-14 VITALS — BP 152/82 | HR 98 | Temp 98.2°F | Resp 16 | Ht 79.0 in | Wt 362.0 lb

## 2019-10-14 DIAGNOSIS — I10 Essential (primary) hypertension: Secondary | ICD-10-CM

## 2019-10-14 DIAGNOSIS — Z9989 Dependence on other enabling machines and devices: Secondary | ICD-10-CM

## 2019-10-14 DIAGNOSIS — G4733 Obstructive sleep apnea (adult) (pediatric): Secondary | ICD-10-CM

## 2019-10-14 DIAGNOSIS — F17219 Nicotine dependence, cigarettes, with unspecified nicotine-induced disorders: Secondary | ICD-10-CM | POA: Diagnosis not present

## 2019-10-14 NOTE — Progress Notes (Signed)
[facility] [address] [place], [place] [address]  Pulmonary Sleep Medicine   Office Visit Note  Patient Name: Trevor Castillo DOB: December 15, 1965 MRN [id number]  Date of Service: 10/14/2019  Complaints/HPI: Pt is here for yearly follow up on osa.  He reports wearing cpap nightly. Pt reports good compliance with CPAP therapy. Cleaning machine by hand, and changing filters and tubing as directed. Denies headaches, sinus issues, palpitations, or hemoptysis.  Compliance reports shows ahi of 1.6, with 100 % compliance.    ROS  General: (-) fever, (-) chills, (-) night sweats, (-) weakness Skin: (-) rashes, (-) itching,. Eyes: (-) visual changes, (-) redness, (-) itching. Nose and Sinuses: (-) nasal stuffiness or itchiness, (-) postnasal drip, (-) nosebleeds, (-) sinus trouble. Mouth and Throat: (-) sore throat, (-) hoarseness. Neck: (-) swollen glands, (-) enlarged thyroid, (-) neck pain. Respiratory: - cough, (-) bloody sputum, - shortness of breath, - wheezing. Cardiovascular: - ankle swelling, (-) chest pain. Lymphatic: (-) lymph node enlargement. Neurologic: (-) numbness, (-) tingling. Psychiatric: (-) anxiety, (-) depression   Current Medication: Outpatient Encounter Medications as of 10/14/2019  Medication Sig  . amoxicillin-clavulanate (AUGMENTIN) 875-125 MG tablet Take 1 tablet by mouth 2 (two) times daily for 10 days.  . ciprofloxacin-dexamethasone (CIPRODEX) OTIC suspension Place 4 drops into the right ear 2 (two) times daily for 7 days.  Marland Kitchen LORazepam (ATIVAN) 0.5 MG tablet Take by mouth.  . Misc. Devices MISC by Does not apply route. cpap device  . naproxen sodium (ALEVE) 220 MG tablet Take 220 mg by mouth.  . losartan-hydrochlorothiazide (HYZAAR) 100-12.5 MG tablet Take by mouth.   No facility-administered encounter medications on file as of 10/14/2019.    Surgical History: Past Surgical History:  Procedure Laterality Date  . ANKLE SURGERY Right   . HIP  SURGERY    . JOINT REPLACEMENT      Medical History: Past Medical History:  Diagnosis Date  . Hypertension   . Sleep apnea     Family History: Family History  Problem Relation Age of Onset  . Diabetes Mother     Social History: Social History   Socioeconomic History  . Marital status: Married    Spouse name: Not on file  . Number of children: Not on file  . Years of education: Not on file  . Highest education level: Not on file  Occupational History  . Not on file  Tobacco Use  . Smoking status: Former Smoker    Types: Cigarettes    Quit date: 05/15/2018    Years since quitting: 1.4  . Smokeless tobacco: Former Clinical biochemist  . Vaping Use: Every day  . Substances: Nicotine, Flavoring  Substance and Sexual Activity  . Alcohol use: Yes    Alcohol/week: 14.0 standard drinks    Types: 14 Shots of liquor per week  . Drug use: No  . Sexual activity: Not on file  Other Topics Concern  . Not on file  Social History Narrative  . Not on file   Social Determinants of Health   Financial Resource Strain:   . Difficulty of Paying Living Expenses: Not on file  Food Insecurity:   . Worried About Programme researcher, broadcasting/film/video in the Last Year: Not on file  . Ran Out of Food in the Last Year: Not on file  Transportation Needs:   . Lack of Transportation (Medical): Not on file  . Lack of Transportation (Non-Medical): Not on file  Physical Activity:   .  Days of Exercise per Week: Not on file  . Minutes of Exercise per Session: Not on file  Stress:   . Feeling of Stress : Not on file  Social Connections:   . Frequency of Communication with Friends and Family: Not on file  . Frequency of Social Gatherings with Friends and Family: Not on file  . Attends Religious Services: Not on file  . Active Member of Clubs or Organizations: Not on file  . Attends Banker Meetings: Not on file  . Marital Status: Not on file  Intimate Partner Violence:   . Fear of Current or  Ex-Partner: Not on file  . Emotionally Abused: Not on file  . Physically Abused: Not on file  . Sexually Abused: Not on file    Vital Signs: Blood pressure (!) 150/96, pulse 98, temperature 98.2 F (36.8 C), resp. rate 16, height 6\' 7"  (2.007 m), weight (!) 362 lb (164.2 kg), SpO2 93 %.  Examination: General Appearance: The patient is well-developed, well-nourished, and in no distress. Skin: Gross inspection of skin unremarkable. Head: normocephalic, no gross deformities. Eyes: no gross deformities noted. ENT: ears appear grossly normal no exudates. Neck: Supple. No thyromegaly. No LAD. Respiratory: clear bilaterally. Cardiovascular: Normal S1 and S2 without murmur or rub. Extremities: No cyanosis. pulses are equal. Neurologic: Alert and oriented. No involuntary movements.  LABS: No results found for this or any previous visit (from the past 2160 hour(s)).  Radiology: No results found.  No results found.  No results found.    Assessment and Plan: Patient Active Problem List   Diagnosis Date Noted  . Obstructive sleep apnea (adult)   10/10/2018  . Left knee injury, initial encounter 09/26/2016  . 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 Essential hypertension 12/24/2014    1. OSA on CPAP Continue to wear cpap nightly.  Good benefits at this time.   2. Essential hypertension Continue current mgmt.   3. Cigarette nicotine dependence with nicotine-induced disorder Quit smoking over a year ago, is using vape pen.   General Counseling: I have discussed the findings of the evaluation and examination with 13/10/2014.  I have also discussed any further diagnostic evaluation thatmay be needed or ordered today. Leodan verbalizes understanding of the findings of todays visit. We also reviewed his medications today and discussed drug interactions and side effects including but not limited excessive drowsiness and altered mental states. We also discussed that  there is always a risk not just to him but also people around him. he has been encouraged to call the office with any questions or concerns that should arise related to todays visit.  No orders of the defined types were placed in this encounter.    Time spent: 25 This patient was seen by Molly Maduro AGNP-C in Collaboration with Dr. Blima Ledger as a part of collaborative care agreement.   I have personally obtained a history, examined the patient, evaluated laboratory and imaging results, formulated the assessment and plan and placed orders.    Freda Munro, MD [facility] Pulmonary and Critical Care Sleep medicine

## 2020-08-05 ENCOUNTER — Ambulatory Visit: Payer: BC Managed Care – PPO

## 2020-08-12 ENCOUNTER — Ambulatory Visit: Payer: BC Managed Care – PPO

## 2020-08-12 ENCOUNTER — Other Ambulatory Visit: Payer: Self-pay

## 2020-08-12 DIAGNOSIS — G4733 Obstructive sleep apnea (adult) (pediatric): Secondary | ICD-10-CM

## 2020-08-12 NOTE — Progress Notes (Signed)
95 percentile pressure 13.8   95th percentile leak 17.3   apnea index 1.3 /hr  apnea-hypopnea index  1.5 /hr   total days used  >4 hr 30 days  total days used <4 hr 0 days  Total compliance 100 percent   He is using back up cpap as other one is making noise. He is eligible for new cpap Nov 2022 under insurance

## 2020-08-13 DIAGNOSIS — E782 Mixed hyperlipidemia: Secondary | ICD-10-CM | POA: Diagnosis not present

## 2020-08-13 DIAGNOSIS — Z125 Encounter for screening for malignant neoplasm of prostate: Secondary | ICD-10-CM | POA: Diagnosis not present

## 2020-08-13 DIAGNOSIS — Z0000 Encounter for general adult medical examination without abnormal findings: Secondary | ICD-10-CM | POA: Diagnosis not present

## 2020-08-13 DIAGNOSIS — I10 Essential (primary) hypertension: Secondary | ICD-10-CM | POA: Diagnosis not present

## 2020-10-13 ENCOUNTER — Ambulatory Visit: Payer: BC Managed Care – PPO | Admitting: Nurse Practitioner

## 2020-10-13 ENCOUNTER — Encounter: Payer: Self-pay | Admitting: Nurse Practitioner

## 2020-10-13 ENCOUNTER — Other Ambulatory Visit: Payer: Self-pay

## 2020-10-13 VITALS — BP 140/86 | HR 99 | Temp 98.0°F | Resp 16 | Ht 79.0 in | Wt 372.4 lb

## 2020-10-13 DIAGNOSIS — I10 Essential (primary) hypertension: Secondary | ICD-10-CM | POA: Diagnosis not present

## 2020-10-13 DIAGNOSIS — Z6841 Body Mass Index (BMI) 40.0 and over, adult: Secondary | ICD-10-CM

## 2020-10-13 DIAGNOSIS — G4733 Obstructive sleep apnea (adult) (pediatric): Secondary | ICD-10-CM

## 2020-10-13 DIAGNOSIS — Z9989 Dependence on other enabling machines and devices: Secondary | ICD-10-CM

## 2020-10-13 NOTE — Progress Notes (Signed)
[facility] [address] [place], [place] [address]  Internal MEDICINE  Office Visit Note  Patient Name: Trevor Castillo  [id number]  [id number]  Date of Service: 10/13/2020  Chief Complaint  Patient presents with   Follow-up    Cpap download    Sleep Apnea    HPI Gilles presents for a follow up for sleep apnea and CPAP use. Most recent CPAP download was on 08/12/20. His CPAP download showed 100 compliance with CPAP use. AHI is 1.5/hr. He is eligible for a new CPAP in November this year under his insurance. He is using a back up device right now because the other one is making noise. He is requesting for the new order for the CPAP machine be placed now so he will be able to get it in November and will not have to wait for it. He is sleeping well, not waking up at night and denies any daytime sleepiness or fatigue.     Current Medication: Outpatient Encounter Medications as of 10/13/2020  Medication Sig   LORazepam (ATIVAN) 0.5 MG tablet Take by mouth.   Misc. Devices MISC by Does not apply route. cpap device   naproxen sodium (ALEVE) 220 MG tablet Take 220 mg by mouth.   losartan-hydrochlorothiazide (HYZAAR) 100-12.5 MG tablet Take by mouth.   No facility-administered encounter medications on file as of 10/13/2020.    Surgical History: Past Surgical History:  Procedure Laterality Date   ANKLE SURGERY Right    HIP SURGERY     JOINT REPLACEMENT      Medical History: Past Medical History:  Diagnosis Date   Hypertension    Sleep apnea     Family History: Family History  Problem Relation Age of Onset   Diabetes Mother     Social History   Socioeconomic History   Marital status: Married    Spouse name: Not on file   Number of children: Not on file   Years of education: Not on file   Highest education level: Not on file  Occupational History   Not on file  Tobacco Use   Smoking status: Former    Types: Cigarettes    Quit date: 05/15/2018    Years  since quitting: 2.4   Smokeless tobacco: Former  Building services engineer Use: Every day   Substances: Nicotine, Flavoring  Substance and Sexual Activity   Alcohol use: Yes    Alcohol/week: 14.0 standard drinks    Types: 14 Shots of liquor per week   Drug use: No   Sexual activity: Not on file  Other Topics Concern   Not on file  Social History Narrative   Not on file   Social Determinants of Health   Financial Resource Strain: Not on file  Food Insecurity: Not on file  Transportation Needs: Not on file  Physical Activity: Not on file  Stress: Not on file  Social Connections: Not on file  Intimate Partner Violence: Not on file      Review of Systems  Constitutional:  Negative for chills, fatigue and unexpected weight change.  HENT:  Negative for congestion, rhinorrhea, sneezing and sore throat.   Eyes:  Negative for redness.  Respiratory:  Negative for cough, chest tightness and shortness of breath.   Cardiovascular:  Negative for chest pain and palpitations.  Gastrointestinal:  Negative for abdominal pain, constipation, diarrhea, nausea and vomiting.  Genitourinary:  Negative for dysuria and frequency.  Musculoskeletal:  Negative for arthralgias, back pain, joint swelling  and neck pain.  Skin:  Negative for rash.  Neurological: Negative.  Negative for tremors and numbness.  Hematological:  Negative for adenopathy. Does not bruise/bleed easily.  Psychiatric/Behavioral:  Negative for behavioral problems (Depression), sleep disturbance and suicidal ideas. The patient is not nervous/anxious.    Vital Signs: BP 140/86 Comment: 148/84  Pulse 99 Comment: 120  Temp 98 F (36.7 C)   Resp 16   Ht 6\' 7"  (2.007 m)   Wt (!) 372 lb 6.4 oz (168.9 kg)   SpO2 98%   BMI 41.95 kg/m    Physical Exam Vitals reviewed.  Constitutional:      General: He is not in acute distress.    Appearance: Normal appearance. He is obese. He is not ill-appearing.  HENT:     Head: Normocephalic  and atraumatic.  Eyes:     Extraocular Movements: Extraocular movements intact.     Pupils: Pupils are equal, round, and reactive to light.  Cardiovascular:     Rate and Rhythm: Normal rate and regular rhythm.     Heart sounds: Normal heart sounds. No murmur heard. Pulmonary:     Effort: Pulmonary effort is normal. No respiratory distress.     Breath sounds: Normal breath sounds. No wheezing.  Neurological:     Mental Status: He is alert and oriented to person, place, and time.     Cranial Nerves: No cranial nerve deficit.     Coordination: Coordination normal.     Gait: Gait normal.  Psychiatric:        Mood and Affect: Mood normal.        Behavior: Behavior normal.     Assessment/Plan: 1. OSA on CPAP Eligible for new CPAP in November 2022. 100% compliance, continue as instructed.  - For home use only DME continuous positive airway pressure (CPAP)  2. Essential hypertension Blood pressure slightly elevated, encouraged to discuss with PCP.   3. Class 3 severe obesity due to excess calories with serious comorbidity and body mass index (BMI) of 40.0 to 44.9 in adult [facility]) Patient is aware that weight loss will improve his sleep apnea and hypertension. He is working on diet and lifestyle modifications. Encouraged to discuss with his PCP further if needed.    General Counseling: yeudiel mateo understanding of the findings of todays visit and agrees with plan of treatment. I have discussed any further diagnostic evaluation that may be needed or ordered today. We also reviewed his medications today. he has been encouraged to call the office with any questions or concerns that should arise related to todays visit.    Orders Placed This Encounter  Procedures   For home use only DME continuous positive airway pressure (CPAP)     No orders of the defined types were placed in this encounter.   Return in about 1 year (around 10/13/2021) for F/U, pulmonary/sleep with [facility]SK or  Briane Birden.   Total time spent:20 Minutes Time spent includes review of chart, medications, test results, and follow up plan with the patient.   [place] Controlled Substance Database was reviewed by me.  This patient was seen by 10/15/2021, FNP-C in collaboration with Dr. Sallyanne Kuster as a part of collaborative care agreement.   Emmali Karow R. Beverely Risen, MSN, FNP-C Internal medicine

## 2020-12-10 ENCOUNTER — Telehealth: Payer: Self-pay

## 2020-12-10 NOTE — Telephone Encounter (Signed)
Patient called to have an order for a new cpap machine put in. I advised I would reach out to his provider and have her put that order in.   Sent a message to Sallyanne Kuster, NP to put an order in for CPAP.

## 2020-12-11 ENCOUNTER — Other Ambulatory Visit: Payer: Self-pay | Admitting: Nurse Practitioner

## 2020-12-11 ENCOUNTER — Telehealth: Payer: Self-pay

## 2020-12-11 DIAGNOSIS — G4733 Obstructive sleep apnea (adult) (pediatric): Secondary | ICD-10-CM

## 2020-12-11 NOTE — Telephone Encounter (Signed)
Patients CPAP order was sent over to [facility]. Waiting for processing. Patient advised.

## 2020-12-18 ENCOUNTER — Other Ambulatory Visit: Payer: Self-pay | Admitting: Nurse Practitioner

## 2021-02-17 ENCOUNTER — Telehealth: Payer: Self-pay

## 2021-02-17 NOTE — Telephone Encounter (Signed)
Order for OSA supplies signed by provider and given to Kelly with [facility]. °

## 2021-04-18 ENCOUNTER — Other Ambulatory Visit: Payer: Self-pay

## 2021-04-18 ENCOUNTER — Encounter: Payer: Self-pay | Admitting: Emergency Medicine

## 2021-04-18 ENCOUNTER — Emergency Department: Payer: BC Managed Care – PPO

## 2021-04-18 ENCOUNTER — Emergency Department
Admission: EM | Admit: 2021-04-18 | Discharge: 2021-04-18 | Disposition: A | Discharge status: Home / Self Care | Payer: BC Managed Care – PPO | Attending: Emergency Medicine | Admitting: Emergency Medicine

## 2021-04-18 DIAGNOSIS — K5730 Diverticulosis of large intestine without perforation or abscess without bleeding: Secondary | ICD-10-CM | POA: Diagnosis not present

## 2021-04-18 DIAGNOSIS — K5792 Diverticulitis of intestine, part unspecified, without perforation or abscess without bleeding: Secondary | ICD-10-CM | POA: Diagnosis not present

## 2021-04-18 DIAGNOSIS — R1032 Left lower quadrant pain: Secondary | ICD-10-CM | POA: Diagnosis not present

## 2021-04-18 DIAGNOSIS — I10 Essential (primary) hypertension: Secondary | ICD-10-CM | POA: Insufficient documentation

## 2021-04-18 DIAGNOSIS — K2289 Other specified disease of esophagus: Secondary | ICD-10-CM | POA: Diagnosis not present

## 2021-04-18 LAB — CBC WITH DIFFERENTIAL/PLATELET
Abs Immature Granulocytes: 0.02 10*3/uL (ref 0.00–0.07)
Basophils Absolute: 0.1 10*3/uL (ref 0.0–0.1)
Basophils Relative: 1 %
Eosinophils Absolute: 0.2 10*3/uL (ref 0.0–0.5)
Eosinophils Relative: 2 %
HCT: 42.4 % (ref 39.0–52.0)
Hemoglobin: 14.7 g/dL (ref 13.0–17.0)
Immature Granulocytes: 0 %
Lymphocytes Relative: 9 %
Lymphs Abs: 0.8 10*3/uL (ref 0.7–4.0)
MCH: 35.8 pg — ABNORMAL HIGH (ref 26.0–34.0)
MCHC: 34.7 g/dL (ref 30.0–36.0)
MCV: 103.2 fL — ABNORMAL HIGH (ref 80.0–100.0)
Monocytes Absolute: 1.3 10*3/uL — ABNORMAL HIGH (ref 0.1–1.0)
Monocytes Relative: 14 %
Neutro Abs: 6.5 10*3/uL (ref 1.7–7.7)
Neutrophils Relative %: 74 %
Platelets: 177 10*3/uL (ref 150–400)
RBC: 4.11 MIL/uL — ABNORMAL LOW (ref 4.22–5.81)
RDW: 12.7 % (ref 11.5–15.5)
WBC: 8.9 10*3/uL (ref 4.0–10.5)
nRBC: 0 % (ref 0.0–0.2)

## 2021-04-18 LAB — HEPATIC FUNCTION PANEL
ALT: 41 U/L (ref 0–44)
AST: 23 U/L (ref 15–41)
Albumin: 3.9 g/dL (ref 3.5–5.0)
Alkaline Phosphatase: 53 U/L (ref 38–126)
Bilirubin, Direct: 0.2 mg/dL (ref 0.0–0.2)
Indirect Bilirubin: 0.8 mg/dL (ref 0.3–0.9)
Total Bilirubin: 1 mg/dL (ref 0.3–1.2)
Total Protein: 7 g/dL (ref 6.5–8.1)

## 2021-04-18 LAB — URINALYSIS, ROUTINE W REFLEX MICROSCOPIC
Bilirubin Urine: NEGATIVE
Glucose, UA: NEGATIVE mg/dL
Hgb urine dipstick: NEGATIVE
Ketones, ur: NEGATIVE mg/dL
Leukocytes,Ua: NEGATIVE
Nitrite: NEGATIVE
Protein, ur: NEGATIVE mg/dL
Specific Gravity, Urine: >1.046 — ABNORMAL HIGH (ref 1.005–1.030)
pH: 7 (ref 5.0–8.0)

## 2021-04-18 LAB — BASIC METABOLIC PANEL
Anion gap: 11 (ref 5–15)
BUN: 15 mg/dL (ref 6–20)
CO2: 24 mmol/L (ref 22–32)
Calcium: 8.9 mg/dL (ref 8.9–10.3)
Chloride: 102 mmol/L (ref 98–111)
Creatinine, Ser: 0.69 mg/dL (ref 0.61–1.24)
GFR, Estimated: >60 mL/min (ref >60)
Glucose, Bld: 113 mg/dL — ABNORMAL HIGH (ref 70–99)
Potassium: 4 mmol/L (ref 3.5–5.1)
Sodium: 137 mmol/L (ref 135–145)

## 2021-04-18 LAB — LIPASE, BLOOD: Lipase: 41 U/L (ref 11–51)

## 2021-04-18 MED ORDER — AMOXICILLIN-POT CLAVULANATE 875-125 MG PO TABS: 1.0000 | ORAL_TABLET | Freq: Three times a day (TID) | ORAL | 0 refills | Status: AC

## 2021-04-18 MED ORDER — PIPERACILLIN-TAZOBACTAM 3.375 G IVPB 30 MIN
3.3750 g | Freq: Once | INTRAVENOUS | Status: AC
  Administered 2021-04-18: 3.375 g via INTRAVENOUS
  Filled 2021-04-18: qty 50

## 2021-04-18 MED ORDER — IOHEXOL 350 MG/ML SOLN
100.0000 mL | Freq: Once | INTRAVENOUS | Status: AC | PRN
  Administered 2021-04-18: 100 mL via INTRAVENOUS

## 2021-04-18 MED ORDER — TRAMADOL HCL 50 MG PO TABS: 50.0000 mg | ORAL_TABLET | Freq: Four times a day (QID) | ORAL | 0 refills | Status: AC | PRN

## 2021-04-18 NOTE — ED Provider Notes (Signed)
? ?[facility] ?Provider Note ? ? ? Event Date/Time  ? First MD Initiated Contact with Patient 04/18/21 [phone]   ?  (approximate) ? ? ?History  ? ?Abdominal Pain ? ? ?HPI ? ?Trevor Castillo is a 56 y.o. male with a history of hypertension and sleep apnea who presents with left lower quadrant abdominal pain over the last several days.  It became more intense and localized today.  He denies associated fever or chills, nausea or vomiting, diarrhea, dysuria, or hematuria.  He states that he last had a bowel movement yesterday morning.  He denies any prior history of this pain.  He has no history of abdominal surgeries. ? ? ? ?Physical Exam  ? ?Triage Vital Signs: ?ED Triage Vitals  ?Enc Vitals Group  ?   BP 04/18/21 0847 (!) 152/102  ?   Pulse Rate 04/18/21 0847 (!) 112  ?   Resp 04/18/21 0847 18  ?   Temp 04/18/21 0847 98.8 ?F (37.1 ?C)  ?   Temp Source 04/18/21 0847 Oral  ?   SpO2 04/18/21 0847 97 %  ?   Weight 04/18/21 0846 (!) 380 lb (172.4 kg)  ?   Height 04/18/21 0846 6\' 7"  (2.007 m)  ?   Head Circumference --   ?   Peak Flow --   ?   Pain Score 04/18/21 0846 9  ?   Pain Loc --   ?   Pain Edu? --   ?   Excl. in GC? --   ? ? ?Most recent vital signs: ?Vitals:  ? 04/18/21 0850 04/18/21 0900  ?BP: (!) 152/102 (!) 160/83  ?Pulse: (!) 112 (!) 105  ?Resp: 18   ?Temp: 98.8 ?F (37.1 ?C)   ?SpO2: 97% 94%  ? ? ? ?General: Awake, no distress.  ?CV:  Good peripheral perfusion.  ?Resp:  Normal effort.  ?Abd:  Soft with moderate left lower quadrant tenderness.  No distention.  ?Other:  No CVA or flank tenderness. ? ? ?ED Results / Procedures / Treatments  ? ?Labs ?(all labs ordered are listed, but only abnormal results are displayed) ?Labs Reviewed  ?BASIC METABOLIC PANEL - Abnormal; Notable for the following components:  ?    Result Value  ? Glucose, Bld 113 (*)   ? All other components within normal limits  ?CBC WITH DIFFERENTIAL/PLATELET - Abnormal; Notable for the following components:  ? RBC 4.11 (*)    ? MCV 103.2 (*)   ? MCH 35.8 (*)   ? Monocytes Absolute 1.3 (*)   ? All other components within normal limits  ?URINALYSIS, ROUTINE W REFLEX MICROSCOPIC - Abnormal; Notable for the following components:  ? Color, Urine STRAW (*)   ? APPearance CLEAR (*)   ? Specific Gravity, Urine >1.046 (*)   ? All other components within normal limits  ?HEPATIC FUNCTION PANEL  ?LIPASE, BLOOD  ? ? ? ?EKG ? ? ? ? ?RADIOLOGY ? ?CT abdomen/pelvis: I independently viewed and interpreted the images; there are no ureteral stones or dilated loops of bowel or evidence of obstruction.  Radiology report was reviewed and indicates acute sigmoid diverticulitis, as well as thickening of the esophagus consistent with reflux. ? ?PROCEDURES: ? ?Critical Care performed: No ? ?Procedures ? ? ?MEDICATIONS ORDERED IN ED: ?Medications  ?piperacillin-tazobactam (ZOSYN) IVPB 3.375 g (3.375 g Intravenous New Bag/Given 04/18/21 1224)  ?iohexol (OMNIPAQUE) 350 MG/ML injection 100 mL (100 mLs Intravenous Contrast Given 04/18/21 1115)  ? ? ? ?IMPRESSION /  MDM / ASSESSMENT AND PLAN / ED COURSE  ?I reviewed the triage vital signs and the nursing notes. ? ?56 year old male with PMH as noted above presents with left lower quadrant abdominal pain over the last several days, becoming more intense today, but with no significant associated symptoms. ? ?On exam the patient is well-appearing.  He is slightly tachycardic with otherwise normal vital signs.  He is tender in the left lower quadrant.  There are no peritoneal signs. ? ?Differential diagnosis includes, but is not limited to, ureteral stone, diverticulitis, colitis, hernia (although none is palpable on exam), UTI/pyelonephritis.  We will obtain lab work-up, CT, and reassess. ? ?----------------------------------------- ?12:25 PM on 04/18/2021 ?----------------------------------------- ? ?Lab work-up is unremarkable.  There is no leukocytosis.  Urinalysis is negative. ? ?CT shows evidence of sigmoid  diverticulitis.  There is also some thickening of the distal esophagus consistent with GERD. ? ?On reassessment, the patient appears comfortable and has not required any pain medication in the ED.  At this time, he is stable for discharge home with antibiotics.  I counseled him on the results of the work-up.  We will give a dose of Zosyn here and then prescribed a 5-day course of Augmentin.  Return precautions given, and he expresses understanding. ? ? ?FINAL CLINICAL IMPRESSION(S) / ED DIAGNOSES  ? ?Final diagnoses:  ?Diverticulitis  ? ? ? ?Rx / DC Orders  ? ?ED Discharge Orders   ? ?      Ordered  ?  amoxicillin-clavulanate (AUGMENTIN) 875-125 MG tablet  Every 8 hours       ? 04/18/21 1223  ?  traMADol (ULTRAM) 50 MG tablet  Every 6 hours PRN       ? 04/18/21 1223  ? ?  ?  ? ?  ? ? ? ?Note:  This document was prepared using Dragon voice recognition software and may include unintentional dictation errors.  ?  Dionne Bucy, MD ?04/18/21 1225 ? ?

## 2021-04-18 NOTE — Discharge Instructions (Addendum)
Take the antibiotic as prescribed and finish the full 5-day course.  You may take tramadol as needed for pain and MiraLAX for constipation.  Return to the ER for new, worsening, or persistent severe pain, vomiting, fever, blood in the stool, or any other new or worsening symptoms that concern you. ? ?Your CT scan shows some thickening of your esophagus, possibly caused by acid reflux.  You should follow-up with your primary care doctor and/or a gastroenterologist to work this up further as needed. ?

## 2021-04-18 NOTE — ED Notes (Signed)
Patient transported to CT 

## 2021-04-18 NOTE — ED Triage Notes (Signed)
Pt via POV from home. Pt c/o intermittent LLQ pain that has been going on a couple weeks but states that it got worse this AM. Denies any urinary symptoms. Denies NVD. Denies abd surgeries. Pt is A&Ox4 and NAD. ?

## 2021-04-21 DIAGNOSIS — G4733 Obstructive sleep apnea (adult) (pediatric): Secondary | ICD-10-CM | POA: Diagnosis not present

## 2021-04-22 DIAGNOSIS — K5792 Diverticulitis of intestine, part unspecified, without perforation or abscess without bleeding: Secondary | ICD-10-CM | POA: Diagnosis not present

## 2021-05-17 ENCOUNTER — Telehealth: Payer: Self-pay

## 2021-05-17 NOTE — Telephone Encounter (Signed)
Order for OSA supplies signed by provider and placed in AHP folder. 

## 2021-05-22 DIAGNOSIS — G4733 Obstructive sleep apnea (adult) (pediatric): Secondary | ICD-10-CM | POA: Diagnosis not present

## 2021-06-16 ENCOUNTER — Ambulatory Visit: Payer: BC Managed Care – PPO

## 2021-06-16 DIAGNOSIS — G4733 Obstructive sleep apnea (adult) (pediatric): Secondary | ICD-10-CM

## 2021-06-16 NOTE — Progress Notes (Signed)
95 percentile pressure 14.3 ? ? 95th percentile leak 7.1 ? ? apnea index 1.1 /hr ? apnea-hypopnea index  1.2 /hr ? ? total days used  >4 hr 30 days ? total days used <4 hr 0 days ? ?Total compliance 100 percent ? ?He is doing great no problems or questions at this time  ?Pt was seen by Tresa Endo  RRT/RCP  from [facility]  ?

## 2021-06-21 DIAGNOSIS — G4733 Obstructive sleep apnea (adult) (pediatric): Secondary | ICD-10-CM | POA: Diagnosis not present

## 2021-07-01 ENCOUNTER — Ambulatory Visit: Payer: BC Managed Care – PPO | Admitting: Internal Medicine

## 2021-07-01 ENCOUNTER — Encounter: Payer: Self-pay | Admitting: Internal Medicine

## 2021-07-01 VITALS — BP 156/88 | HR 99 | Temp 98.3°F | Resp 16 | Ht 79.0 in | Wt 381.4 lb

## 2021-07-01 DIAGNOSIS — Z6841 Body Mass Index (BMI) 40.0 and over, adult: Secondary | ICD-10-CM | POA: Diagnosis not present

## 2021-07-01 DIAGNOSIS — G4733 Obstructive sleep apnea (adult) (pediatric): Secondary | ICD-10-CM | POA: Diagnosis not present

## 2021-07-01 DIAGNOSIS — F17219 Nicotine dependence, cigarettes, with unspecified nicotine-induced disorders: Secondary | ICD-10-CM | POA: Diagnosis not present

## 2021-07-01 NOTE — Patient Instructions (Signed)

## 2021-07-01 NOTE — Progress Notes (Signed)
[facility] [address] [place], [place] [address]  Pulmonary Sleep Medicine   Office Visit Note  Patient Name: Trevor Castillo DOB: 26-Apr-1965 MRN [id number]  Date of Service: 07/01/2021  Complaints/HPI: OSA on cpap. He is doing well overall. He has been on the CPAP and his download shows 100% compliance. No sinus issues denies having mask issues. The mask fit remains good. He has not been able to lose weight. He states he has hip pain issues so that he is not able to execise. Diverticulitis recently ER visit. He is supposed to have follow up with his primary  ROS  General: (-) fever, (-) chills, (-) night sweats, (-) weakness Skin: (-) rashes, (-) itching,. Eyes: (-) visual changes, (-) redness, (-) itching. Nose and Sinuses: (-) nasal stuffiness or itchiness, (-) postnasal drip, (-) nosebleeds, (-) sinus trouble. Mouth and Throat: (-) sore throat, (-) hoarseness. Neck: (-) swollen glands, (-) enlarged thyroid, (-) neck pain. Respiratory: - cough, (-) bloody sputum, - shortness of breath, - wheezing. Cardiovascular: - ankle swelling, (-) chest pain. Lymphatic: (-) lymph node enlargement. Neurologic: (-) numbness, (-) tingling. Psychiatric: (-) anxiety, (-) depression   Current Medication: Outpatient Encounter Medications as of 07/01/2021  Medication Sig   LORazepam (ATIVAN) 0.5 MG tablet Take 1 tablet by mouth 2 (two) times daily as needed.   losartan-hydrochlorothiazide (HYZAAR) 100-12.5 MG tablet Take by mouth.   Misc. Devices MISC by Does not apply route. cpap device   Multiple Vitamin (MULTI-VITAMIN) tablet Take 1 tablet by mouth daily.   naproxen sodium (ALEVE) 220 MG tablet Take 220 mg by mouth.   omeprazole (PRILOSEC) 20 MG capsule Take by mouth.   [DISCONTINUED] LORazepam (ATIVAN) 0.5 MG tablet Take by mouth. (Patient not taking: Reported on 07/01/2021)   No facility-administered encounter medications on file as of 07/01/2021.    Surgical  History: Past Surgical History:  Procedure Laterality Date   ANKLE SURGERY Right    HIP SURGERY     JOINT REPLACEMENT      Medical History: Past Medical History:  Diagnosis Date   Hypertension    Sleep apnea     Family History: Family History  Problem Relation Age of Onset   Diabetes Mother    Kidney disease Father    Rheum arthritis Sister    Heart attack Brother     Social History: Social History   Socioeconomic History   Marital status: Married    Spouse name: Not on file   Number of children: Not on file   Years of education: Not on file   Highest education level: Not on file  Occupational History   Not on file  Tobacco Use   Smoking status: Former    Types: Cigarettes    Quit date: 05/15/2018    Years since quitting: 3.1   Smokeless tobacco: Former  Building services engineer Use: Every day   Substances: Nicotine, Flavoring  Substance and Sexual Activity   Alcohol use: Yes    Alcohol/week: 14.0 standard drinks    Types: 14 Shots of liquor per week   Drug use: No   Sexual activity: Not on file  Other Topics Concern   Not on file  Social History Narrative   Not on file   Social Determinants of Health   Financial Resource Strain: Not on file  Food Insecurity: Not on file  Transportation Needs: Not on file  Physical Activity: Not on file  Stress: Not on file  Social Connections:  Not on file  Intimate Partner Violence: Not on file    Vital Signs: Blood pressure (!) 156/88, pulse 99, temperature 98.3 F (36.8 C), resp. rate 16, height 6\' 7"  (2.007 m), weight (!) 381 lb 6.4 oz (173 kg), SpO2 97 %.  Examination: General Appearance: The patient is well-developed, well-nourished, and in no distress. Skin: Gross inspection of skin unremarkable. Head: normocephalic, no gross deformities. Eyes: no gross deformities noted. ENT: ears appear grossly normal no exudates. Neck: Supple. No thyromegaly. No LAD. Respiratory: no rhonchi noted. Cardiovascular:  Normal S1 and S2 without murmur or rub. Extremities: No cyanosis. pulses are equal. Neurologic: Alert and oriented. No involuntary movements.  LABS: Recent Results (from the past 2160 hour(s))  Urinalysis, Routine w reflex microscopic Urine, Clean Catch     Status: Abnormal   Collection Time: 04/18/21  9:25 AM  Result Value Ref Range   Color, Urine STRAW (A) YELLOW   APPearance CLEAR (A) CLEAR   Specific Gravity, Urine >1.046 (H) 1.005 - 1.030   pH 7.0 5.0 - 8.0   Glucose, UA NEGATIVE NEGATIVE mg/dL   Hgb urine dipstick NEGATIVE NEGATIVE   Bilirubin Urine NEGATIVE NEGATIVE   Ketones, ur NEGATIVE NEGATIVE mg/dL   Protein, ur NEGATIVE NEGATIVE mg/dL   Nitrite NEGATIVE NEGATIVE   Leukocytes,Ua NEGATIVE NEGATIVE    Comment: Performed at [facility], [address] [place], [place] [address]  Basic metabolic panel     Status: Abnormal   Collection Time: 04/18/21  9:38 AM  Result Value Ref Range   Sodium 137 135 - 145 mmol/L   Potassium 4.0 3.5 - 5.1 mmol/L   Chloride 102 98 - 111 mmol/L   CO2 24 22 - 32 mmol/L   Glucose, Bld 113 (H) 70 - 99 mg/dL    Comment: Glucose reference range applies only to samples taken after fasting for at least 8 hours.   BUN 15 6 - 20 mg/dL   Creatinine, Ser 6.040.69 0.61 - 1.24 mg/dL   Calcium 8.9 8.9 - 54.010.3 mg/dL   GFR, Estimated >98>60 >11>60 mL/min    Comment: (NOTE) Calculated using the CKD-EPI Creatinine Equation (2021)    Anion gap 11 5 - 15    Comment: Performed at [facility], [address] [place], [place] [address]  Hepatic function panel     Status: None   Collection Time: 04/18/21  9:38 AM  Result Value Ref Range   Total Protein 7.0 6.5 - 8.1 g/dL   Albumin 3.9 3.5 - 5.0 g/dL   AST 23 15 - 41 U/L   ALT 41 0 - 44 U/L   Alkaline Phosphatase 53 38 - 126 U/L   Total Bilirubin 1.0 0.3 - 1.2 mg/dL   Bilirubin, Direct 0.2 0.0 - 0.2 mg/dL   Indirect Bilirubin 0.8 0.3 - 0.9 mg/dL    Comment: Performed at [facility]  [facility], [address] [place], [place] [address]  Lipase, blood     Status: None   Collection Time: 04/18/21  9:38 AM  Result Value Ref Range   Lipase 41 11 - 51 U/L    Comment: Performed at [facility], [address] [place], [place] [address]  CBC with Differential     Status: Abnormal   Collection Time: 04/18/21  9:38 AM  Result Value Ref Range   WBC 8.9 4.0 - 10.5 K/uL   RBC 4.11 (L) 4.22 - 5.81 MIL/uL   Hemoglobin 14.7 13.0 - 17.0 g/dL   HCT  42.4 39.0 - 52.0 %   MCV 103.2 (H) 80.0 - 100.0 fL   MCH 35.8 (H) 26.0 - 34.0 pg   MCHC 34.7 30.0 - 36.0 g/dL   RDW 67.1 24.5 - 80.9 %   Platelets 177 150 - 400 K/uL   nRBC 0.0 0.0 - 0.2 %   Neutrophils Relative % 74 %   Neutro Abs 6.5 1.7 - 7.7 K/uL   Lymphocytes Relative 9 %   Lymphs Abs 0.8 0.7 - 4.0 K/uL   Monocytes Relative 14 %   Monocytes Absolute 1.3 (H) 0.1 - 1.0 K/uL   Eosinophils Relative 2 %   Eosinophils Absolute 0.2 0.0 - 0.5 K/uL   Basophils Relative 1 %   Basophils Absolute 0.1 0.0 - 0.1 K/uL   Immature Granulocytes 0 %   Abs Immature Granulocytes 0.02 0.00 - 0.07 K/uL    Comment: Performed at [facility], [address], [place], [place] [address]    Radiology: CT ABDOMEN PELVIS W CONTRAST  Result Date: 04/18/2021 CLINICAL DATA:  Left lower quadrant abdominal pain, intermittent pain for few weeks. EXAM: CT ABDOMEN AND PELVIS WITH CONTRAST TECHNIQUE: Multidetector CT imaging of the abdomen and pelvis was performed using the standard protocol following bolus administration of intravenous contrast. RADIATION DOSE REDUCTION: This exam was performed according to the departmental dose-optimization program which includes automated exposure control, adjustment of the mA and/or kV according to patient size and/or use of iterative reconstruction technique. CONTRAST:  OMNIPAQUE IOHEXOL 350 MG/ML SOLN COMPARISON:  None. FINDINGS: Lower chest: No acute abnormality. Hepatobiliary: No focal liver  abnormality is seen. No gallstones, gallbladder wall thickening, or biliary dilatation. Pancreas: Unremarkable. No pancreatic ductal dilatation or surrounding inflammatory changes. Spleen: Normal in size without focal abnormality. Adrenals/Urinary Tract: Adrenal glands are unremarkable. Kidneys are normal, without renal calculi, focal lesion, or hydronephrosis. Bladder is unremarkable. Stomach/Bowel: Mild thickening of the distal esophageal wall. The stomach is unremarkable. Small bowel loops are normal in caliber. Normal appendix. There is focal proximal sigmoid colonic wall thickening with adjacent fat stranding consistent with acute diverticulitis. There is no extraluminal free air or drainable fluid collection/abscess. Vascular/Lymphatic: Aortic atherosclerosis. No enlarged abdominal or pelvic lymph nodes. Reproductive: Limited evaluation of the prostate due to streak artifact from bilateral hip arthroplasty. No significant finding Other: Fat containing left inguinal hernia. No abdominopelvic ascites. Musculoskeletal: Degenerate disc disease of the lumbar spine. Bilateral hip arthroplasty with intact hardware. IMPRESSION: 1. Acute sigmoid colonic diverticulitis. No adjacent drainable fluid collection or abscess. 2. Diffuse thickening of the distal esophageal wall, which may be secondary to gastroesophageal reflux, gastroenterology consultation for further management is recommended. 3.  Atherosclerotic calcification of aorta. Electronically Signed   By: Larose Hires D.O.   On: 04/18/2021 11:50    No results found.  No results found.    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 Obstructive sleep apnea Compliance is excellent continue with current pressures  2. Class 3  severe obesity due to excess calories with serious comorbidity and body mass index (BMI) of 40.0 to 44.9 in adult [facility]) Obesity Counseling: Had a lengthy discussion regarding patients BMI and weight issues. Patient was instructed on portion control as well as increased activity. Also discussed caloric restrictions with trying to  maintain intake less than 2000 Kcal. Discussions were made in accordance with the 5As of weight management. Simple actions such as not eating late and if able to, taking a walk is suggested.   3. Cigarette nicotine dependence with nicotine-induced disorder Smoking cessation instruction/counseling given:  counseled patient on the dangers of tobacco use, advised patient to stop smoking, and reviewed strategies to maximize success. He is currently vaping   General Counseling: I have discussed the findings of the evaluation and examination with Molly Maduro.  I have also discussed any further diagnostic evaluation thatmay be needed or ordered today. Azlaan verbalizes understanding of the findings of todays visit. We also reviewed his medications today and discussed drug interactions and side effects including but not limited excessive drowsiness and altered mental states. We also discussed that there is always a risk not just to him but also people around him. he has been encouraged to call the office with any questions or concerns that should arise related to todays visit.  No orders of the defined types were placed in this encounter.    Time spent: 83  I have personally obtained a history, examined the patient, evaluated laboratory and imaging results, formulated the assessment and plan and placed orders.    Yevonne Pax, MD [facility] Pulmonary and Critical Care Sleep medicine

## 2021-07-22 DIAGNOSIS — G4733 Obstructive sleep apnea (adult) (pediatric): Secondary | ICD-10-CM | POA: Diagnosis not present

## 2021-08-21 DIAGNOSIS — G4733 Obstructive sleep apnea (adult) (pediatric): Secondary | ICD-10-CM | POA: Diagnosis not present

## 2021-09-21 DIAGNOSIS — G4733 Obstructive sleep apnea (adult) (pediatric): Secondary | ICD-10-CM | POA: Diagnosis not present

## 2021-10-14 ENCOUNTER — Ambulatory Visit: Payer: BC Managed Care – PPO | Admitting: Internal Medicine

## 2021-10-22 DIAGNOSIS — G4733 Obstructive sleep apnea (adult) (pediatric): Secondary | ICD-10-CM | POA: Diagnosis not present

## 2021-11-02 DIAGNOSIS — F411 Generalized anxiety disorder: Secondary | ICD-10-CM | POA: Insufficient documentation

## 2021-11-03 DIAGNOSIS — D126 Benign neoplasm of colon, unspecified: Secondary | ICD-10-CM | POA: Diagnosis not present

## 2021-11-03 DIAGNOSIS — F411 Generalized anxiety disorder: Secondary | ICD-10-CM | POA: Diagnosis not present

## 2021-11-03 DIAGNOSIS — Z23 Encounter for immunization: Secondary | ICD-10-CM | POA: Diagnosis not present

## 2021-11-03 DIAGNOSIS — I10 Essential (primary) hypertension: Secondary | ICD-10-CM | POA: Diagnosis not present

## 2021-11-03 DIAGNOSIS — Z0000 Encounter for general adult medical examination without abnormal findings: Secondary | ICD-10-CM | POA: Diagnosis not present

## 2021-11-21 DIAGNOSIS — G4733 Obstructive sleep apnea (adult) (pediatric): Secondary | ICD-10-CM | POA: Diagnosis not present

## 2021-12-22 DIAGNOSIS — G4733 Obstructive sleep apnea (adult) (pediatric): Secondary | ICD-10-CM | POA: Diagnosis not present

## 2022-01-21 DIAGNOSIS — G4733 Obstructive sleep apnea (adult) (pediatric): Secondary | ICD-10-CM | POA: Diagnosis not present

## 2022-01-26 DIAGNOSIS — I10 Essential (primary) hypertension: Secondary | ICD-10-CM | POA: Diagnosis not present

## 2022-01-26 DIAGNOSIS — E7800 Pure hypercholesterolemia, unspecified: Secondary | ICD-10-CM | POA: Diagnosis not present

## 2022-01-26 DIAGNOSIS — R7989 Other specified abnormal findings of blood chemistry: Secondary | ICD-10-CM | POA: Diagnosis not present

## 2022-02-02 DIAGNOSIS — I10 Essential (primary) hypertension: Secondary | ICD-10-CM | POA: Diagnosis not present

## 2022-02-21 DIAGNOSIS — G4733 Obstructive sleep apnea (adult) (pediatric): Secondary | ICD-10-CM | POA: Diagnosis not present

## 2022-02-23 DIAGNOSIS — Z86010 Personal history of colonic polyps: Secondary | ICD-10-CM | POA: Diagnosis not present

## 2022-02-23 DIAGNOSIS — K5790 Diverticulosis of intestine, part unspecified, without perforation or abscess without bleeding: Secondary | ICD-10-CM | POA: Diagnosis not present

## 2022-02-23 DIAGNOSIS — K219 Gastro-esophageal reflux disease without esophagitis: Secondary | ICD-10-CM | POA: Diagnosis not present

## 2022-03-16 DIAGNOSIS — I10 Essential (primary) hypertension: Secondary | ICD-10-CM | POA: Diagnosis not present

## 2022-03-16 DIAGNOSIS — I3139 Other pericardial effusion (noninflammatory): Secondary | ICD-10-CM | POA: Diagnosis not present

## 2022-03-16 DIAGNOSIS — R000 Tachycardia, unspecified: Secondary | ICD-10-CM | POA: Diagnosis not present

## 2022-03-16 DIAGNOSIS — R002 Palpitations: Secondary | ICD-10-CM | POA: Diagnosis not present

## 2022-03-17 ENCOUNTER — Other Ambulatory Visit: Payer: Self-pay | Admitting: Internal Medicine

## 2022-03-17 DIAGNOSIS — R0602 Shortness of breath: Secondary | ICD-10-CM

## 2022-03-17 DIAGNOSIS — R002 Palpitations: Secondary | ICD-10-CM

## 2022-03-30 DIAGNOSIS — R002 Palpitations: Secondary | ICD-10-CM | POA: Diagnosis not present

## 2022-03-30 DIAGNOSIS — R0602 Shortness of breath: Secondary | ICD-10-CM | POA: Diagnosis not present

## 2022-03-31 ENCOUNTER — Telehealth (HOSPITAL_COMMUNITY): Payer: Self-pay | Admitting: *Deleted

## 2022-03-31 ENCOUNTER — Encounter (HOSPITAL_COMMUNITY): Payer: Self-pay

## 2022-03-31 MED ORDER — IVABRADINE HCL 7.5 MG PO TABS: ORAL_TABLET | ORAL | 0 refills | Status: DC

## 2022-03-31 NOTE — Telephone Encounter (Signed)
Reaching out to patient to offer assistance regarding upcoming cardiac imaging study; pt verbalizes understanding of appt date/time, parking situation and where to check in, pre-test NPO status and medications ordered, and verified current allergies; name and call back number provided for further questions should they arise  Gordy Clement RN Navigator Cardiac Imaging [facility] [phone] office [phone] cell  Patient to take 71m ivabradine two hours prior to his cardiac CT scan.

## 2022-04-04 ENCOUNTER — Ambulatory Visit: Admission: RE | Admit: 2022-04-04 | Payer: BC Managed Care – PPO | Source: Ambulatory Visit

## 2022-04-04 ENCOUNTER — Ambulatory Visit
Admission: RE | Admit: 2022-04-04 | Discharge: 2022-04-04 | Disposition: A | Discharge status: Home / Self Care | Payer: BC Managed Care – PPO | Source: Ambulatory Visit | Attending: Internal Medicine | Admitting: Internal Medicine

## 2022-04-04 DIAGNOSIS — R002 Palpitations: Secondary | ICD-10-CM | POA: Insufficient documentation

## 2022-04-04 DIAGNOSIS — R0602 Shortness of breath: Secondary | ICD-10-CM

## 2022-04-04 DIAGNOSIS — I2510 Atherosclerotic heart disease of native coronary artery without angina pectoris: Secondary | ICD-10-CM | POA: Diagnosis not present

## 2022-04-04 MED ORDER — NITROGLYCERIN 0.4 MG SL SUBL
0.8000 mg | SUBLINGUAL_TABLET | Freq: Once | SUBLINGUAL | Status: AC
  Administered 2022-04-04: 0.8 mg via SUBLINGUAL

## 2022-04-04 MED ORDER — IOHEXOL 350 MG/ML SOLN
100.0000 mL | Freq: Once | INTRAVENOUS | Status: AC | PRN
  Administered 2022-04-04: 100 mL via INTRAVENOUS

## 2022-04-04 MED ORDER — METOPROLOL TARTRATE 5 MG/5ML IV SOLN
10.0000 mg | Freq: Once | INTRAVENOUS | Status: AC
  Administered 2022-04-04: 10 mg via INTRAVENOUS

## 2022-04-04 NOTE — Progress Notes (Signed)
Patient tolerated procedure well. Ambulate w/o difficulty. Denies any lightheadedness or being dizzy. Pt denies any pain at this time. Sitting in chair, pt is encouraged to drink additional water throughout the day and reason explained to patient. Patient verbalized understanding and all questions answered. ABC intact. No further needs at this time. Discharge from procedure area w/o issues.  

## 2022-05-04 DIAGNOSIS — I10 Essential (primary) hypertension: Secondary | ICD-10-CM | POA: Diagnosis not present

## 2022-05-08 IMAGING — CT CT ABD-PELV W/ CM
3 of 6 series · 15 of 46 positions shown, 17 images · IV contrast (APPLIED)
Comparison: None.

CLINICAL DATA: Left lower quadrant abdominal pain, intermittent
pain for few weeks.

EXAM:
CT ABDOMEN AND PELVIS WITH CONTRAST
TECHNIQUE: Multidetector CT imaging of the abdomen and pelvis was performed
using the standard protocol following bolus administration of
intravenous contrast.

[Series 2: routine abd/pel with · axial · 0.98mm/px · z∈[-1008,-563]mm · 10 of 109 slices shown, 12 images]
[im 10/109  soft-tissue]
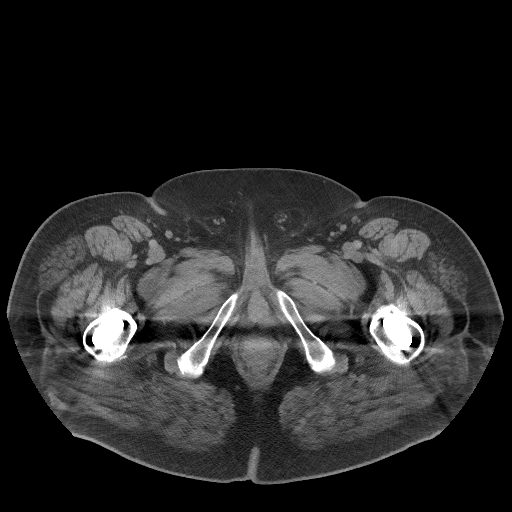
[im 10/109  bone]
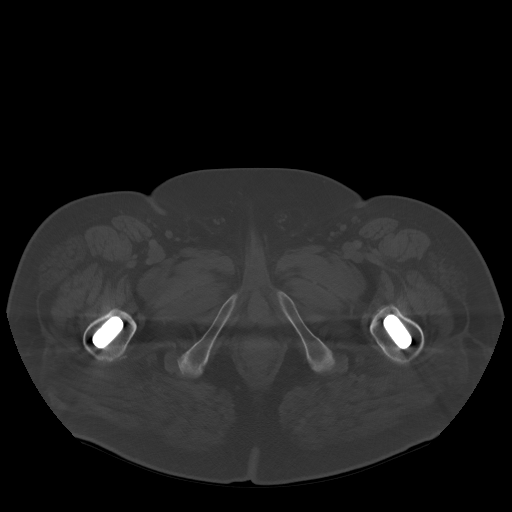
[im 20/109  soft-tissue]
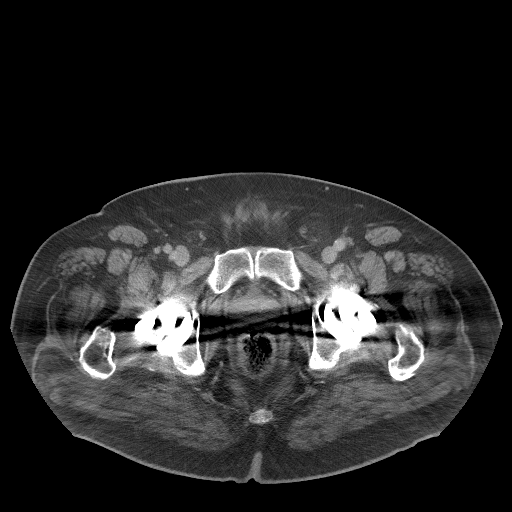
[im 30/109  soft-tissue]
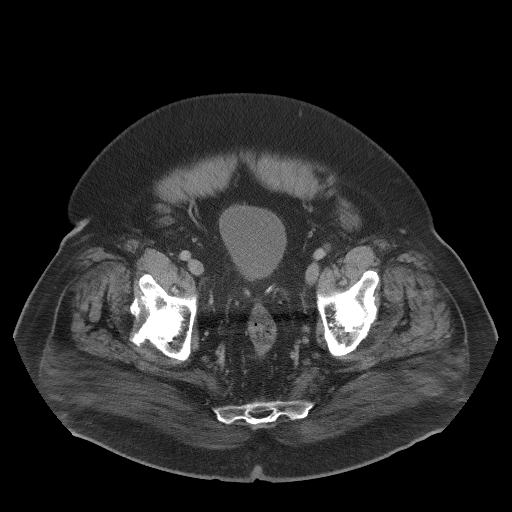
[im 40/109  soft-tissue]
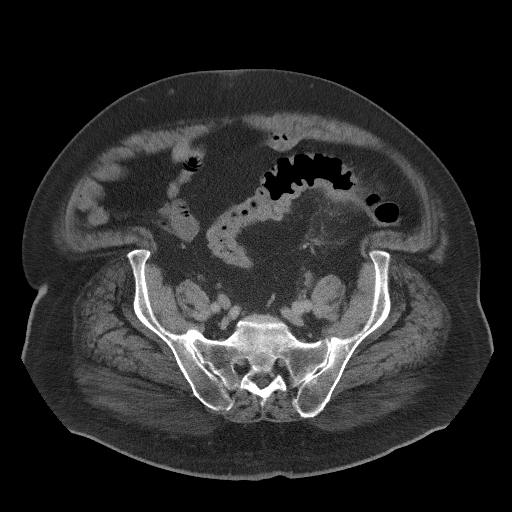
[im 50/109  soft-tissue]
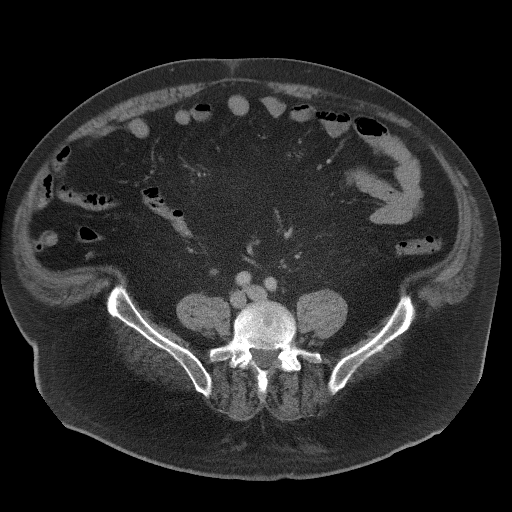
[im 59/109  soft-tissue]
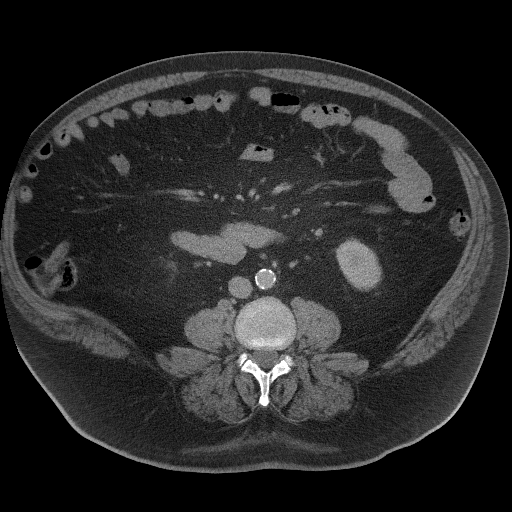
[im 69/109  soft-tissue]
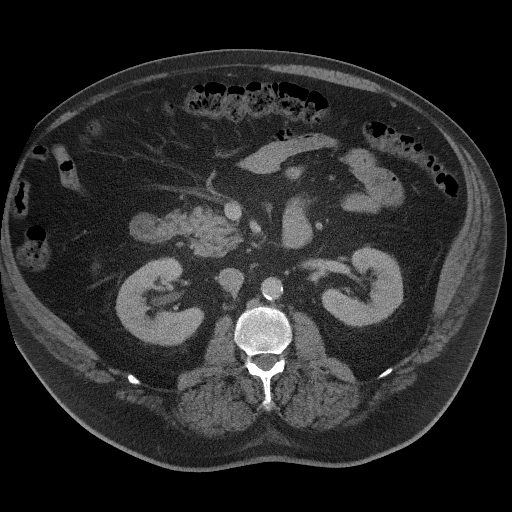
[im 79/109  soft-tissue]
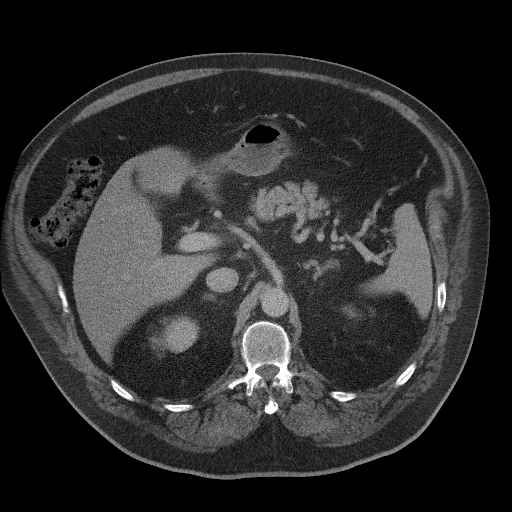
[im 89/109  soft-tissue]
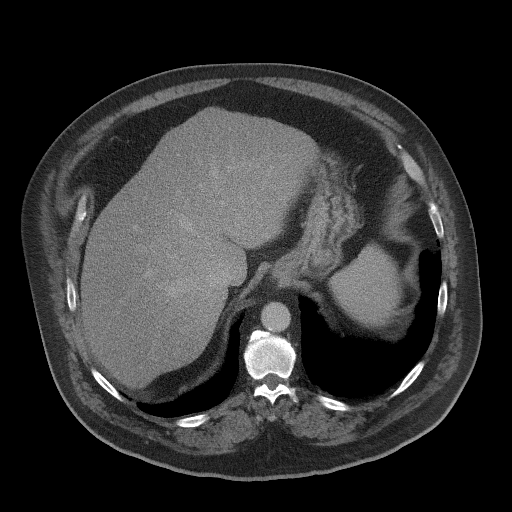
[im 89/109  bone]
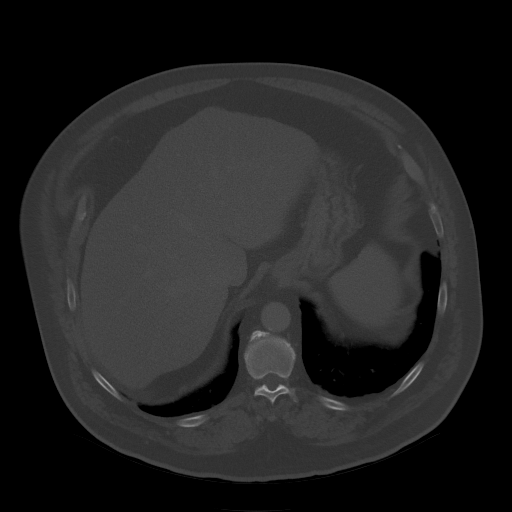
[im 99/109  soft-tissue]
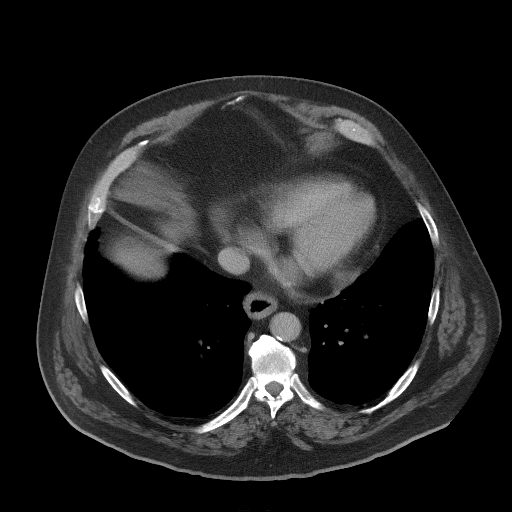

[Series 4: lung · axial · 0.98mm/px · z∈[-633,-573]mm · 2 of 37 slices shown]
[im 13/37  bone]
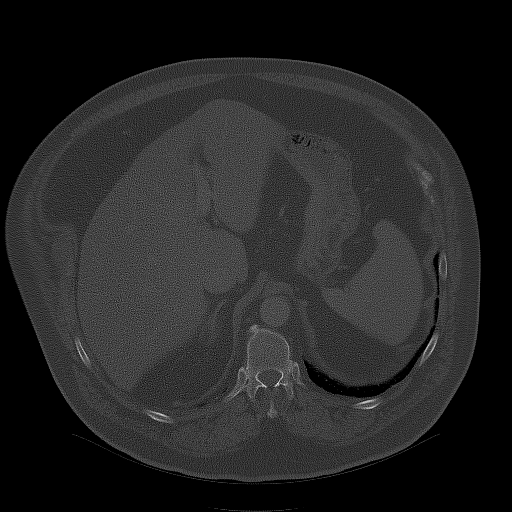
[im 25/37  bone]
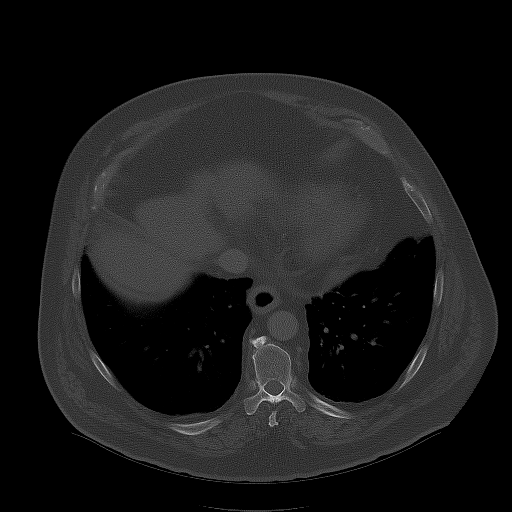

[Series 5: coronal st · coronal · 0.97mm/px · 3 of 141 slices shown]
[im 47/141  soft-tissue]
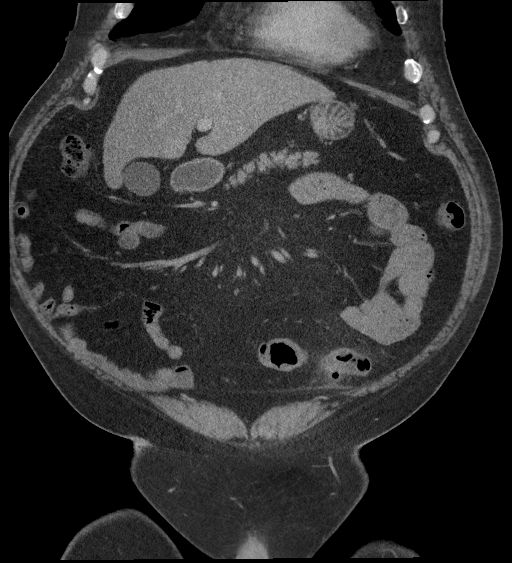
[im 63/141  soft-tissue]
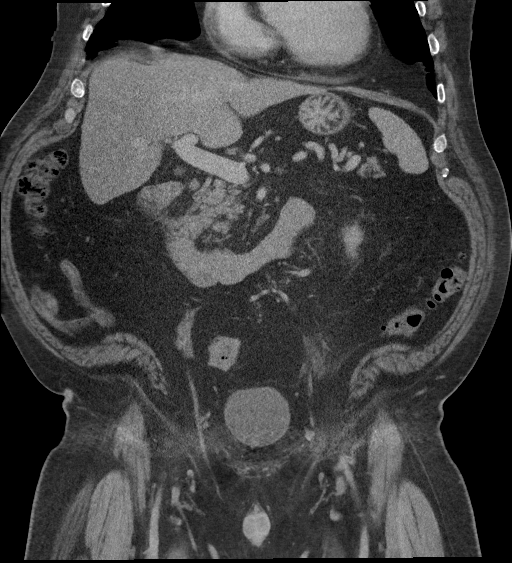
[im 78/141  soft-tissue]
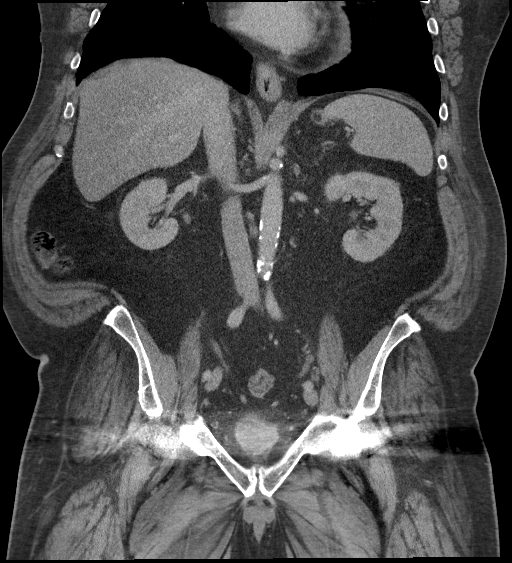

[15 of 46 positions shown; findings below may reference images not displayed]

RADIATION DOSE REDUCTION: This exam was performed according to the
departmental dose-optimization program which includes automated
exposure control, adjustment of the mA and/or kV according to
patient size and/or use of iterative reconstruction technique.

CONTRAST:  100mL OMNIPAQUE IOHEXOL 350 MG/ML SOLN
FINDINGS: Lower chest: No acute abnormality.

Hepatobiliary: No focal liver abnormality is seen. No gallstones,
gallbladder wall thickening, or biliary dilatation.

Pancreas: Unremarkable. No pancreatic ductal dilatation or
surrounding inflammatory changes.

Spleen: Normal in size without focal abnormality.

Adrenals/Urinary Tract: Adrenal glands are unremarkable. Kidneys are
normal, without renal calculi, focal lesion, or hydronephrosis.
Bladder is unremarkable.

Stomach/Bowel: Mild thickening of the distal esophageal wall. The
stomach is unremarkable. Small bowel loops are normal in caliber.
Normal appendix. There is focal proximal sigmoid colonic wall
thickening with adjacent fat stranding consistent with acute
diverticulitis. There is no extraluminal free air or drainable fluid
collection/abscess.

Vascular/Lymphatic: Aortic atherosclerosis. No enlarged abdominal or
pelvic lymph nodes.

Reproductive: Limited evaluation of the prostate due to streak
artifact from bilateral hip arthroplasty. No significant finding

Other: Fat containing left inguinal hernia. No abdominopelvic
ascites.

Musculoskeletal: Degenerate disc disease of the lumbar spine.
Bilateral hip arthroplasty with intact hardware.
IMPRESSION: 1. Acute sigmoid colonic diverticulitis. No adjacent drainable fluid
collection or abscess.

2. Diffuse thickening of the distal esophageal wall, which may be
secondary to gastroesophageal reflux, gastroenterology consultation
for further management is recommended.

3.  Atherosclerotic calcification of aorta.

## 2022-05-20 DIAGNOSIS — R7989 Other specified abnormal findings of blood chemistry: Secondary | ICD-10-CM | POA: Diagnosis not present

## 2022-05-24 DIAGNOSIS — R0602 Shortness of breath: Secondary | ICD-10-CM | POA: Diagnosis not present

## 2022-05-24 DIAGNOSIS — R000 Tachycardia, unspecified: Secondary | ICD-10-CM | POA: Diagnosis not present

## 2022-05-24 DIAGNOSIS — I2510 Atherosclerotic heart disease of native coronary artery without angina pectoris: Secondary | ICD-10-CM | POA: Diagnosis not present

## 2022-05-24 DIAGNOSIS — R002 Palpitations: Secondary | ICD-10-CM | POA: Diagnosis not present

## 2022-06-07 ENCOUNTER — Encounter: Payer: Self-pay | Admitting: Internal Medicine

## 2022-06-08 ENCOUNTER — Inpatient Hospital Stay
Admission: EM | Admit: 2022-06-08 | Discharge: 2022-06-13 | DRG: 862 | Disposition: A | Discharge status: Home / Self Care | Payer: BC Managed Care – PPO | Attending: Internal Medicine | Admitting: Internal Medicine

## 2022-06-08 ENCOUNTER — Ambulatory Visit: Payer: BC Managed Care – PPO | Admitting: Anesthesiology

## 2022-06-08 ENCOUNTER — Ambulatory Visit
Admission: RE | Admit: 2022-06-08 | Discharge: 2022-06-08 | Disposition: A | Discharge status: Home / Self Care | Payer: BC Managed Care – PPO | Source: Home / Self Care | Attending: Internal Medicine | Admitting: Internal Medicine

## 2022-06-08 ENCOUNTER — Encounter: Payer: Self-pay | Admitting: Emergency Medicine

## 2022-06-08 ENCOUNTER — Encounter
Admission: RE | Disposition: A | Discharge status: Home / Self Care | Payer: Self-pay | Source: Home / Self Care | Attending: Internal Medicine

## 2022-06-08 ENCOUNTER — Other Ambulatory Visit: Payer: Self-pay

## 2022-06-08 ENCOUNTER — Emergency Department: Payer: BC Managed Care – PPO

## 2022-06-08 ENCOUNTER — Encounter: Payer: Self-pay | Admitting: Internal Medicine

## 2022-06-08 DIAGNOSIS — D122 Benign neoplasm of ascending colon: Secondary | ICD-10-CM | POA: Diagnosis not present

## 2022-06-08 DIAGNOSIS — K635 Polyp of colon: Secondary | ICD-10-CM | POA: Diagnosis not present

## 2022-06-08 DIAGNOSIS — K9171 Accidental puncture and laceration of a digestive system organ or structure during a digestive system procedure: Secondary | ICD-10-CM | POA: Diagnosis not present

## 2022-06-08 DIAGNOSIS — K659 Peritonitis, unspecified: Secondary | ICD-10-CM | POA: Diagnosis not present

## 2022-06-08 DIAGNOSIS — I10 Essential (primary) hypertension: Secondary | ICD-10-CM | POA: Insufficient documentation

## 2022-06-08 DIAGNOSIS — R6520 Severe sepsis without septic shock: Secondary | ICD-10-CM | POA: Diagnosis present

## 2022-06-08 DIAGNOSIS — Z79899 Other long term (current) drug therapy: Secondary | ICD-10-CM | POA: Insufficient documentation

## 2022-06-08 DIAGNOSIS — Z1211 Encounter for screening for malignant neoplasm of colon: Secondary | ICD-10-CM | POA: Diagnosis not present

## 2022-06-08 DIAGNOSIS — Z6841 Body Mass Index (BMI) 40.0 and over, adult: Secondary | ICD-10-CM | POA: Insufficient documentation

## 2022-06-08 DIAGNOSIS — G4733 Obstructive sleep apnea (adult) (pediatric): Secondary | ICD-10-CM | POA: Diagnosis not present

## 2022-06-08 DIAGNOSIS — Z9109 Other allergy status, other than to drugs and biological substances: Secondary | ICD-10-CM | POA: Diagnosis not present

## 2022-06-08 DIAGNOSIS — E876 Hypokalemia: Secondary | ICD-10-CM | POA: Diagnosis not present

## 2022-06-08 DIAGNOSIS — R109 Unspecified abdominal pain: Secondary | ICD-10-CM | POA: Diagnosis not present

## 2022-06-08 DIAGNOSIS — G4730 Sleep apnea, unspecified: Secondary | ICD-10-CM | POA: Insufficient documentation

## 2022-06-08 DIAGNOSIS — K5720 Diverticulitis of large intestine with perforation and abscess without bleeding: Secondary | ICD-10-CM | POA: Diagnosis not present

## 2022-06-08 DIAGNOSIS — Z791 Long term (current) use of non-steroidal anti-inflammatories (NSAID): Secondary | ICD-10-CM

## 2022-06-08 DIAGNOSIS — K648 Other hemorrhoids: Secondary | ICD-10-CM | POA: Diagnosis not present

## 2022-06-08 DIAGNOSIS — K7290 Hepatic failure, unspecified without coma: Secondary | ICD-10-CM | POA: Diagnosis present

## 2022-06-08 DIAGNOSIS — Z09 Encounter for follow-up examination after completed treatment for conditions other than malignant neoplasm: Secondary | ICD-10-CM

## 2022-06-08 DIAGNOSIS — K219 Gastro-esophageal reflux disease without esophagitis: Secondary | ICD-10-CM | POA: Diagnosis present

## 2022-06-08 DIAGNOSIS — K5730 Diverticulosis of large intestine without perforation or abscess without bleeding: Secondary | ICD-10-CM | POA: Insufficient documentation

## 2022-06-08 DIAGNOSIS — K640 First degree hemorrhoids: Secondary | ICD-10-CM | POA: Diagnosis not present

## 2022-06-08 DIAGNOSIS — K760 Fatty (change of) liver, not elsewhere classified: Secondary | ICD-10-CM | POA: Diagnosis not present

## 2022-06-08 DIAGNOSIS — Z0389 Encounter for observation for other suspected diseases and conditions ruled out: Secondary | ICD-10-CM | POA: Diagnosis not present

## 2022-06-08 DIAGNOSIS — Z8249 Family history of ischemic heart disease and other diseases of the circulatory system: Secondary | ICD-10-CM | POA: Diagnosis not present

## 2022-06-08 DIAGNOSIS — K5790 Diverticulosis of intestine, part unspecified, without perforation or abscess without bleeding: Secondary | ICD-10-CM | POA: Diagnosis not present

## 2022-06-08 DIAGNOSIS — A419 Sepsis, unspecified organism: Secondary | ICD-10-CM | POA: Diagnosis not present

## 2022-06-08 DIAGNOSIS — K2970 Gastritis, unspecified, without bleeding: Secondary | ICD-10-CM | POA: Insufficient documentation

## 2022-06-08 DIAGNOSIS — I700 Atherosclerosis of aorta: Secondary | ICD-10-CM | POA: Diagnosis not present

## 2022-06-08 DIAGNOSIS — Y838 Other surgical procedures as the cause of abnormal reaction of the patient, or of later complication, without mention of misadventure at the time of the procedure: Secondary | ICD-10-CM | POA: Diagnosis present

## 2022-06-08 DIAGNOSIS — K631 Perforation of intestine (nontraumatic): Secondary | ICD-10-CM | POA: Diagnosis not present

## 2022-06-08 DIAGNOSIS — Z833 Family history of diabetes mellitus: Secondary | ICD-10-CM | POA: Diagnosis not present

## 2022-06-08 DIAGNOSIS — K2100 Gastro-esophageal reflux disease with esophagitis, without bleeding: Secondary | ICD-10-CM | POA: Insufficient documentation

## 2022-06-08 DIAGNOSIS — F17290 Nicotine dependence, other tobacco product, uncomplicated: Secondary | ICD-10-CM | POA: Diagnosis present

## 2022-06-08 DIAGNOSIS — Z87891 Personal history of nicotine dependence: Secondary | ICD-10-CM | POA: Insufficient documentation

## 2022-06-08 DIAGNOSIS — K2950 Unspecified chronic gastritis without bleeding: Secondary | ICD-10-CM | POA: Diagnosis not present

## 2022-06-08 DIAGNOSIS — R1084 Generalized abdominal pain: Secondary | ICD-10-CM | POA: Diagnosis not present

## 2022-06-08 DIAGNOSIS — Z841 Family history of disorders of kidney and ureter: Secondary | ICD-10-CM | POA: Diagnosis not present

## 2022-06-08 DIAGNOSIS — Z86010 Personal history of colonic polyps: Secondary | ICD-10-CM | POA: Diagnosis not present

## 2022-06-08 DIAGNOSIS — T8144XA Sepsis following a procedure, initial encounter: Principal | ICD-10-CM | POA: Diagnosis present

## 2022-06-08 HISTORY — PX: ESOPHAGOGASTRODUODENOSCOPY: SHX5428

## 2022-06-08 HISTORY — PX: COLONOSCOPY: SHX5424

## 2022-06-08 LAB — LACTIC ACID, PLASMA
Lactic Acid, Venous: 2.2 mmol/L (ref 0.5–1.9)
Lactic Acid, Venous: 1.7 mmol/L (ref 0.5–1.9)

## 2022-06-08 LAB — CBC
HCT: 45.2 % (ref 39.0–52.0)
Hemoglobin: 15.6 g/dL (ref 13.0–17.0)
MCH: 35.4 pg — ABNORMAL HIGH (ref 26.0–34.0)
MCHC: 34.5 g/dL (ref 30.0–36.0)
MCV: 102.5 fL — ABNORMAL HIGH (ref 80.0–100.0)
Platelets: 187 10*3/uL (ref 150–400)
RBC: 4.41 MIL/uL (ref 4.22–5.81)
RDW: 12.2 % (ref 11.5–15.5)
WBC: 10.4 10*3/uL (ref 4.0–10.5)
nRBC: 0 % (ref 0.0–0.2)

## 2022-06-08 LAB — CBC WITH DIFFERENTIAL/PLATELET
Abs Immature Granulocytes: 0.03 10*3/uL (ref 0.00–0.07)
Basophils Absolute: 0 10*3/uL (ref 0.0–0.1)
Basophils Relative: 0 %
Eosinophils Absolute: 0.1 10*3/uL (ref 0.0–0.5)
Eosinophils Relative: 1 %
HCT: 40.1 % (ref 39.0–52.0)
Hemoglobin: 14 g/dL (ref 13.0–17.0)
Immature Granulocytes: 0 %
Lymphocytes Relative: 3 %
Lymphs Abs: 0.3 10*3/uL — ABNORMAL LOW (ref 0.7–4.0)
MCH: 35.7 pg — ABNORMAL HIGH (ref 26.0–34.0)
MCHC: 34.9 g/dL (ref 30.0–36.0)
MCV: 102.3 fL — ABNORMAL HIGH (ref 80.0–100.0)
Monocytes Absolute: 0.6 10*3/uL (ref 0.1–1.0)
Monocytes Relative: 6 %
Neutro Abs: 8.4 10*3/uL — ABNORMAL HIGH (ref 1.7–7.7)
Neutrophils Relative %: 90 %
Platelets: 152 10*3/uL (ref 150–400)
RBC: 3.92 MIL/uL — ABNORMAL LOW (ref 4.22–5.81)
RDW: 12.3 % (ref 11.5–15.5)
WBC: 9.4 10*3/uL (ref 4.0–10.5)
nRBC: 0 % (ref 0.0–0.2)

## 2022-06-08 LAB — COMPREHENSIVE METABOLIC PANEL
ALT: 42 U/L (ref 0–44)
AST: 31 U/L (ref 15–41)
Albumin: 4.1 g/dL (ref 3.5–5.0)
Alkaline Phosphatase: 52 U/L (ref 38–126)
Anion gap: 10 (ref 5–15)
BUN: 11 mg/dL (ref 6–20)
CO2: 23 mmol/L (ref 22–32)
Calcium: 9.2 mg/dL (ref 8.9–10.3)
Chloride: 101 mmol/L (ref 98–111)
Creatinine, Ser: 0.91 mg/dL (ref 0.61–1.24)
GFR, Estimated: >60 mL/min (ref >60)
Glucose, Bld: 110 mg/dL — ABNORMAL HIGH (ref 70–99)
Potassium: 3.7 mmol/L (ref 3.5–5.1)
Sodium: 134 mmol/L — ABNORMAL LOW (ref 135–145)
Total Bilirubin: 1.3 mg/dL — ABNORMAL HIGH (ref 0.3–1.2)
Total Protein: 7.6 g/dL (ref 6.5–8.1)

## 2022-06-08 LAB — URINALYSIS, ROUTINE W REFLEX MICROSCOPIC
Bilirubin Urine: NEGATIVE
Glucose, UA: NEGATIVE mg/dL
Hgb urine dipstick: NEGATIVE
Ketones, ur: NEGATIVE mg/dL
Leukocytes,Ua: NEGATIVE
Nitrite: NEGATIVE
Protein, ur: NEGATIVE mg/dL
Specific Gravity, Urine: 1.015 (ref 1.005–1.030)
pH: 5 (ref 5.0–8.0)

## 2022-06-08 LAB — LIPASE, BLOOD: Lipase: 50 U/L (ref 11–51)

## 2022-06-08 SURGERY — COLONOSCOPY
Anesthesia: General

## 2022-06-08 MED ORDER — SODIUM CHLORIDE 0.9 % IV BOLUS
1000.0000 mL | Freq: Once | INTRAVENOUS | Status: AC
  Administered 2022-06-08: 1000 mL via INTRAVENOUS

## 2022-06-08 MED ORDER — LOSARTAN POTASSIUM-HCTZ 100-12.5 MG PO TABS: 1.0000 | ORAL_TABLET | Freq: Every day | ORAL | Status: DC

## 2022-06-08 MED ORDER — ACETAMINOPHEN 325 MG PO TABS
650.0000 mg | ORAL_TABLET | Freq: Once | ORAL | Status: AC
  Administered 2022-06-08: 650 mg via ORAL
  Filled 2022-06-08: qty 2

## 2022-06-08 MED ORDER — LOSARTAN POTASSIUM 50 MG PO TABS
100.0000 mg | ORAL_TABLET | Freq: Every day | ORAL | Status: DC
  Administered 2022-06-09 – 2022-06-13 (×5): 100 mg via ORAL
  Filled 2022-06-08 (×5): qty 2

## 2022-06-08 MED ORDER — LIDOCAINE HCL (CARDIAC) PF 100 MG/5ML IV SOSY
PREFILLED_SYRINGE | INTRAVENOUS | Status: DC | PRN
  Administered 2022-06-08: 100 mg via INTRAVENOUS

## 2022-06-08 MED ORDER — ROSUVASTATIN CALCIUM 10 MG PO TABS
10.0000 mg | ORAL_TABLET | Freq: Every day | ORAL | Status: DC
  Administered 2022-06-09 – 2022-06-13 (×5): 10 mg via ORAL
  Filled 2022-06-08 (×5): qty 1

## 2022-06-08 MED ORDER — PIPERACILLIN-TAZOBACTAM 3.375 G IVPB 30 MIN
3.3750 g | Freq: Three times a day (TID) | INTRAVENOUS | Status: AC
  Administered 2022-06-09 – 2022-06-11 (×9): 3.375 g via INTRAVENOUS
  Filled 2022-06-08 (×18): qty 50

## 2022-06-08 MED ORDER — MORPHINE SULFATE (PF) 4 MG/ML IV SOLN
4.0000 mg | Freq: Once | INTRAVENOUS | Status: AC
  Administered 2022-06-08: 4 mg via INTRAVENOUS
  Filled 2022-06-08: qty 1

## 2022-06-08 MED ORDER — PROPOFOL 1000 MG/100ML IV EMUL
INTRAVENOUS | Status: AC
  Filled 2022-06-08: qty 100

## 2022-06-08 MED ORDER — HEPARIN SODIUM (PORCINE) 5000 UNIT/ML IJ SOLN
5000.0000 [IU] | Freq: Two times a day (BID) | INTRAMUSCULAR | Status: DC
  Administered 2022-06-08 – 2022-06-12 (×9): 5000 [IU] via SUBCUTANEOUS
  Filled 2022-06-08 (×10): qty 1

## 2022-06-08 MED ORDER — PIPERACILLIN-TAZOBACTAM 3.375 G IVPB 30 MIN
3.3750 g | Freq: Once | INTRAVENOUS | Status: AC
  Administered 2022-06-08: 3.375 g via INTRAVENOUS
  Filled 2022-06-08: qty 50

## 2022-06-08 MED ORDER — ACETAMINOPHEN 650 MG RE SUPP: 650.0000 mg | Freq: Four times a day (QID) | RECTAL | Status: DC | PRN

## 2022-06-08 MED ORDER — SODIUM CHLORIDE 0.9 % IV SOLN: INTRAVENOUS | Status: DC

## 2022-06-08 MED ORDER — AMLODIPINE BESYLATE 5 MG PO TABS
5.0000 mg | ORAL_TABLET | Freq: Every day | ORAL | Status: DC
  Administered 2022-06-09 – 2022-06-13 (×5): 5 mg via ORAL
  Filled 2022-06-08 (×6): qty 1

## 2022-06-08 MED ORDER — MORPHINE SULFATE (PF) 2 MG/ML IV SOLN
2.0000 mg | INTRAVENOUS | Status: DC | PRN
  Administered 2022-06-08 – 2022-06-11 (×8): 2 mg via INTRAVENOUS
  Filled 2022-06-08 (×8): qty 1

## 2022-06-08 MED ORDER — METOPROLOL SUCCINATE ER 50 MG PO TB24
50.0000 mg | ORAL_TABLET | Freq: Every day | ORAL | Status: DC
  Administered 2022-06-09 – 2022-06-13 (×5): 50 mg via ORAL
  Filled 2022-06-08 (×5): qty 1

## 2022-06-08 MED ORDER — PHENYLEPHRINE 80 MCG/ML (10ML) SYRINGE FOR IV PUSH (FOR BLOOD PRESSURE SUPPORT)
PREFILLED_SYRINGE | INTRAVENOUS | Status: AC
  Filled 2022-06-08: qty 10

## 2022-06-08 MED ORDER — IOHEXOL 350 MG/ML SOLN
100.0000 mL | Freq: Once | INTRAVENOUS | Status: AC | PRN
  Administered 2022-06-08: 100 mL via INTRAVENOUS

## 2022-06-08 MED ORDER — HYDRALAZINE HCL 20 MG/ML IJ SOLN: 5.0000 mg | Freq: Four times a day (QID) | INTRAMUSCULAR | Status: DC | PRN

## 2022-06-08 MED ORDER — LACTATED RINGERS IV SOLN: INTRAVENOUS | Status: AC

## 2022-06-08 MED ORDER — HYDROCHLOROTHIAZIDE 12.5 MG PO TABS
12.5000 mg | ORAL_TABLET | Freq: Every day | ORAL | Status: DC
  Administered 2022-06-09 – 2022-06-13 (×5): 12.5 mg via ORAL
  Filled 2022-06-08 (×5): qty 1

## 2022-06-08 MED ORDER — ONDANSETRON HCL 4 MG/2ML IJ SOLN: 4.0000 mg | Freq: Four times a day (QID) | INTRAMUSCULAR | Status: DC | PRN

## 2022-06-08 MED ORDER — ONDANSETRON HCL 4 MG/2ML IJ SOLN
4.0000 mg | Freq: Once | INTRAMUSCULAR | Status: AC
  Administered 2022-06-08: 4 mg via INTRAVENOUS
  Filled 2022-06-08: qty 2

## 2022-06-08 MED ORDER — ACETAMINOPHEN 325 MG PO TABS
650.0000 mg | ORAL_TABLET | Freq: Four times a day (QID) | ORAL | Status: DC | PRN
  Administered 2022-06-09 – 2022-06-12 (×4): 650 mg via ORAL
  Filled 2022-06-08 (×7): qty 2

## 2022-06-08 MED ORDER — ONDANSETRON HCL 4 MG PO TABS: 4.0000 mg | ORAL_TABLET | Freq: Four times a day (QID) | ORAL | Status: DC | PRN

## 2022-06-08 MED ORDER — SODIUM CHLORIDE 0.9% FLUSH
3.0000 mL | Freq: Two times a day (BID) | INTRAVENOUS | Status: DC
  Administered 2022-06-08 – 2022-06-10 (×3): 3 mL via INTRAVENOUS

## 2022-06-08 MED ORDER — PIPERACILLIN-TAZOBACTAM 3.375 G IVPB 30 MIN: 3.3750 g | Freq: Four times a day (QID) | INTRAVENOUS | Status: DC

## 2022-06-08 MED ORDER — PHENYLEPHRINE HCL (PRESSORS) 10 MG/ML IV SOLN
INTRAVENOUS | Status: DC | PRN
  Administered 2022-06-08 (×2): 50 ug via INTRAVENOUS
  Administered 2022-06-08: 100 ug via INTRAVENOUS

## 2022-06-08 MED ORDER — PROPOFOL 500 MG/50ML IV EMUL
INTRAVENOUS | Status: DC | PRN
  Administered 2022-06-08: 150 ug/kg/min via INTRAVENOUS

## 2022-06-08 NOTE — Anesthesia Postprocedure Evaluation (Signed)
Anesthesia Post Note  Patient: Trevor NICHELSONProcedure(s) Performed: COLONOSCOPY ESOPHAGOGASTRODUODENOSCOPY (EGD)  Patient location during evaluation: Endoscopy Anesthesia Type: General Level of consciousness: awake and alert Pain management: pain level controlled Vital Signs Assessment: post-procedure vital signs reviewed and stable Respiratory status: spontaneous breathing, nonlabored ventilation, respiratory function stable and patient connected to nasal cannula oxygen Cardiovascular status: blood pressure returned to baseline and stable Postop Assessment: no apparent nausea or vomiting Anesthetic complications: no   There were no known notable events for this encounter.   Last Vitals:  Vitals:   06/08/22 0721 06/08/22 0902  BP: (!) 153/87 99/66  Pulse: 97 75  Resp: 18 15  Temp: (!) 35.6 C (!) 36.1 C  SpO2: 97% 98%    Last Pain:  Vitals:   06/08/22 0902  TempSrc: Temporal  PainSc: Asleep                 Corinda Gubler

## 2022-06-08 NOTE — Interval H&P Note (Signed)
History and Physical Interval Note:  06/08/2022 8:13 AM  Trevor Castillo  has presented today for surgery, with the diagnosis of Gastroesophageal reflux disease, unspecified whether esophagitis present (K21.9) Personal history of colonic polyps (Z86.010) Diverticulosis (K57.90).  The various methods of treatment have been discussed with the patient and family. After consideration of risks, benefits and other options for treatment, the patient has consented to  Procedure(s): COLONOSCOPY (N/A) ESOPHAGOGASTRODUODENOSCOPY (EGD) (N/A) as a surgical intervention.  The patient's history has been reviewed, patient examined, no change in status, stable for surgery.  I have reviewed the patient's chart and labs.  Questions were answered to the patient's satisfaction.     [place], [place]

## 2022-06-08 NOTE — Transfer of Care (Signed)
Immediate Anesthesia Transfer of Care Note  Patient: Trevor Castillo  Procedure(s) Performed: COLONOSCOPY ESOPHAGOGASTRODUODENOSCOPY (EGD)  Patient Location: PACU  Anesthesia Type:General  Level of Consciousness: awake and sedated  Airway & Oxygen Therapy: Patient Spontanous Breathing and Patient connected to face mask oxygen  Post-op Assessment: Report given to RN and Post -op Vital signs reviewed and stable  Post vital signs: Reviewed and stable  Last Vitals:  Vitals Value Taken Time  BP 99/66 06/08/22 0902  Temp 36.1 C 06/08/22 0902  Pulse 75 06/08/22 0902  Resp 15 06/08/22 0902  SpO2 98 % 06/08/22 0902    Last Pain:  Vitals:   06/08/22 0902  TempSrc: Temporal  PainSc: Asleep         Complications: There were no known notable events for this encounter.

## 2022-06-08 NOTE — Anesthesia Preprocedure Evaluation (Signed)
Anesthesia Evaluation  Patient identified by MRN, date of birth, ID band Patient awake    Reviewed: Allergy & Precautions, NPO status , Patient's Chart, lab work & pertinent test results  History of Anesthesia Complications Negative for: history of anesthetic complications  Airway Mallampati: III  TM Distance: >3 FB Neck ROM: Full    Dental no notable dental hx. (+) Teeth Intact   Pulmonary sleep apnea and Continuous Positive Airway Pressure Ventilation , neg COPD, Patient abstained from smoking.Not current smoker, former smoker   Pulmonary exam normal breath sounds clear to auscultation       Cardiovascular Exercise Tolerance: Good METShypertension, Pt. on medications (-) CAD and (-) Past MI (-) dysrhythmias  Rhythm:Regular Rate:Normal - Systolic murmurs Recent TTE unremarkable   Neuro/Psych negative neurological ROS  negative psych ROS   GI/Hepatic ,neg GERD  ,,(+)     (-) substance abuse    Endo/Other  neg diabetes  Morbid obesity  Renal/GU negative Renal ROS     Musculoskeletal   Abdominal  (+) + obese  Peds  Hematology   Anesthesia Other Findings Past Medical History: No date: Diverticulitis No date: Hypertension No date: Sleep apnea  Reproductive/Obstetrics                             Anesthesia Physical Anesthesia Plan  ASA: 3  Anesthesia Plan: General   Post-op Pain Management: Minimal or no pain anticipated   Induction: Intravenous  PONV Risk Score and Plan: 2 and Propofol infusion, TIVA and Ondansetron  Airway Management Planned: Nasal CPAP and Natural Airway  Additional Equipment: None  Intra-op Plan:   Post-operative Plan:   Informed Consent: I have reviewed the patients History and Physical, chart, labs and discussed the procedure including the risks, benefits and alternatives for the proposed anesthesia with the patient or authorized representative who has  indicated his/her understanding and acceptance.     Dental advisory given  Plan Discussed with: CRNA and Surgeon  Anesthesia Plan Comments: (Discussed risks of anesthesia with patient, including possibility of difficulty with spontaneous ventilation under anesthesia necessitating airway intervention, PONV, and rare risks such as cardiac or respiratory or neurological events, and allergic reactions. Discussed the role of CRNA in patient's perioperative care. Patient understands. Patient informed about increased incidence of above perioperative risk due to high BMI. Patient understands.  )       Anesthesia Quick Evaluation

## 2022-06-08 NOTE — Op Note (Signed)
[facility] Patient Name: Trevor Castillo Procedure Date: 06/08/2022 7:45 AM MRN: [id number] Account #: [id number] Date of Birth: April 03, 1965 Admit Type: Outpatient Age: 57 Room: [facility] ENDO ROOM 2 Gender: Male Note Status: Finalized Instrument Name: Upper Endoscope [id number] Procedure:             Upper GI endoscopy Indications:           Gastro-esophageal reflux disease Providers:             Boykin Nearing. Norma Fredrickson MD, MD Referring MD:          Doran Stabler, MD (Referring MD) Medicines:             Propofol per Anesthesia Complications:         No immediate complications. Procedure:             Pre-Anesthesia Assessment:                        - The risks and benefits of the procedure and the                         sedation options and risks were discussed with the                         patient. All questions were answered and informed                         consent was obtained.                        - Patient identification and proposed procedure were                         verified prior to the procedure by the nurse. The                         procedure was verified in the procedure room.                        After obtaining informed consent, the endoscope was                         passed under direct vision. Throughout the procedure,                         the patient's blood pressure, pulse, and oxygen                         saturations were monitored continuously. The Endoscope                         was introduced through the mouth, and advanced to the                         third part of duodenum. The upper GI endoscopy was                         accomplished without difficulty. The patient tolerated  the procedure well. Findings:      The esophagus was normal.      Diffuse mild inflammation characterized by congestion (edema) and       erythema was found in the entire examined stomach.      The  examined duodenum was normal.      The exam was otherwise without abnormality. Impression:            - Normal esophagus.                        - Gastritis.                        - Normal examined duodenum.                        - The examination was otherwise normal.                        - No specimens collected. Recommendation:        - Continue present medications.                        - Proceed with colonoscopy Procedure Code(s):     --- Professional ---                        [phone], Esophagogastroduodenoscopy, flexible,                         transoral; diagnostic, including collection of                         specimen(s) by brushing or washing, when performed                         (separate procedure) Diagnosis Code(s):     --- Professional ---                        K21.9, Gastro-esophageal reflux disease without                         esophagitis                        K29.70, Gastritis, unspecified, without bleeding CPT copyright 2022 [employer]. All rights reserved. The codes documented in this report are preliminary and upon coder review may  be revised to meet current compliance requirements. Stanton Kidney MD, MD 06/08/2022 8:32:11 AM This report has been signed electronically. Number of Addenda: 0 Note Initiated On: 06/08/2022 7:45 AM Estimated Blood Loss:  Estimated blood loss: none.      [facility]

## 2022-06-08 NOTE — ED Notes (Signed)
Critical Results Lactic Acid 2.2 MD notified 

## 2022-06-08 NOTE — H&P (Signed)
History and Physical     Patient: Trevor Castillo:[id number] DOB: 01-24-1966 DOA: 06/08/2022 DOS: the patient was seen and examined on 06/09/2022 PCP: Tessie Fass, MD   Patient coming from: Home  Chief Complaint: Abdominal pain.  HISTORY OF PRESENT ILLNESS: Trevor Castillo is an 57 y.o. male seen in ed today for adb pain. Pt had EGD/colonoscopy today.  Abd pain. RUQ to RLQ. C-scopy/ egd and polyps removed. Abd pain since procedure. Positive distention. Positive BS hypoactive. Imaging shows : Wall thickening, inflammation and extraluminal gas associated with the ascending colon near the hepatic flexure. This may be in area of biopsy on recent colonoscopy, compatible with perforation. No free intraperitoneal air elsewhere within the abdomen or pelvis. Fever/ tachy cardiac. Radiologist.called: Pericolonic stranding and gas.  Gen surg abx and consult.    Past Medical History:  Diagnosis Date   Diverticulitis    Hypertension    Sleep apnea    Review of Systems  Gastrointestinal:  Positive for abdominal distention, abdominal pain and nausea.  All other systems reviewed and are negative.  Allergies  Allergen Reactions   Gramineae Pollens Other (See Comments)    Itchy, watery eyes and sneezing    Past Surgical History:  Procedure Laterality Date   ANKLE SURGERY Right    HIP SURGERY     JOINT REPLACEMENT     MEDICATIONS: Prior to Admission medications   Medication Sig Start Date End Date Taking? Authorizing Provider  amLODipine (NORVASC) 5 MG tablet Take 5 mg by mouth daily.    [provider]  ivabradine (CORLANOR) 7.5 MG TABS tablet Take tablets ( ) TWO hours prior to your cardiac CT scan. 03/31/22   Debbe Odea, MD  LORazepam (ATIVAN) 0.5 MG tablet Take 1 tablet by mouth 2 (two) times daily as needed. 02/23/21   [provider]  losartan-hydrochlorothiazide (HYZAAR) 100-12.5 MG tablet Take by mouth. 04/27/18 06/08/22   [provider]  metoprolol succinate (TOPROL-XL) 50 MG 24 hr tablet Take 50 mg by mouth daily. Take with or immediately following a meal.    [provider]  Misc. Devices MISC by Does not apply route. cpap device    [provider]  Multiple Vitamin (MULTI-VITAMIN) tablet Take 1 tablet by mouth daily.    [provider]  naproxen sodium (ALEVE) 220 MG tablet Take 220 mg by mouth.    [provider]  omeprazole (PRILOSEC) 20 MG capsule Take by mouth. 04/22/21 06/08/22  [provider]  rosuvastatin (CRESTOR) 10 MG tablet Take 10 mg by mouth daily.    [provider]    amLODipine  5 mg Oral Daily   heparin  5,000 Units Subcutaneous Q12H   losartan  100 mg Oral Daily   And   hydrochlorothiazide  12.5 mg Oral Daily   metoprolol succinate  50 mg Oral Daily   rosuvastatin  10 mg Oral Daily   sodium chloride flush  3 mL Intravenous Q12H     lactated ringers 50 mL/hr at 06/08/22 2102   piperacillin-tazobactam 3.375 g (06/09/22 0151)      ED Course: Pt in Ed patient is alert awake oriented meets sepsis criteria. Vitals:   06/08/22 2201 06/08/22 2332 06/09/22 0035 06/09/22 0132  BP: 110/73 (!) 112/57 99/64 104/66  Pulse: (!) 116 (!) 117 (!) 120 (!) 114  Resp: Temp: 99.3 F (37.4 C) (!) 101.6 F (38.7 C) (!) 102.2 F (39 C) (!) 100.8 F (38.2 C)  TempSrc: Oral Oral  Oral  SpO2: 96% 94% 92% 91%  Weight:      Height:       Total I/O In: -  Out: 300 [Urine:300] SpO2: 91 % O2 Flow Rate (L/min): 2 L/min Blood work in ed shows: Hyponatremia of 134 glucose 110 normal LFTs except for bili of 1.3 and normal kidney function. Initial cassette 2.2 with a repeat of 1.7.   Results for orders placed or performed during the hospital encounter of 06/08/22 (from the past 72 hour(s))  Lipase, blood     Status: None   Collection Time: 06/08/22  4:40 PM  Result Value Ref Range   Lipase 50 11 - 51 U/L    Comment:  Performed at [facility], [address] [place], [place] [address]  Comprehensive metabolic panel     Status: Abnormal   Collection Time: 06/08/22  4:40 PM  Result Value Ref Range   Sodium 134 (L) 135 - 145 mmol/L   Potassium 3.7 3.5 - 5.1 mmol/L   Chloride 101 98 - 111 mmol/L   CO2 23 22 - 32 mmol/L   Glucose, Bld 110 (H) 70 - 99 mg/dL    Comment: Glucose reference range applies only to samples taken after fasting for at least 8 hours.   BUN 11 6 - 20 mg/dL   Creatinine, Ser 6.04 0.61 - 1.24 mg/dL   Calcium 9.2 8.9 - 54.0 mg/dL   Total Protein 7.6 6.5 - 8.1 g/dL   Albumin 4.1 3.5 - 5.0 g/dL   AST 31 15 - 41 U/L   ALT 42 0 - 44 U/L   Alkaline Phosphatase 52 38 - 126 U/L   Total Bilirubin 1.3 (H) 0.3 - 1.2 mg/dL   GFR, Estimated >98 >11 mL/min    Comment: (NOTE) Calculated using the CKD-EPI Creatinine Equation (2021)    Anion gap 10 5 - 15    Comment: Performed at [facility], [address] [place], [place] [address]  CBC     Status: Abnormal   Collection Time: 06/08/22  4:40 PM  Result Value Ref Range   WBC 10.4 4.0 - 10.5 K/uL   RBC 4.41 4.22 - 5.81 MIL/uL   Hemoglobin 15.6 13.0 - 17.0 g/dL   HCT 29.5 62.1 - 30.8 %   MCV 102.5 (H) 80.0 - 100.0 fL   MCH 35.4 (H) 26.0 - 34.0 pg   MCHC 34.5 30.0 - 36.0 g/dL   RDW 65.7 84.6 - 96.2 %   Platelets 187 150 - 400 K/uL   nRBC 0.0 0.0 - 0.2 %    Comment: Performed at [facility], [address] [place], [place] [address]  Urinalysis, Routine w reflex microscopic -Urine, Clean Catch     Status: Abnormal   Collection Time: 06/08/22  5:17 PM  Result Value Ref Range   Color, Urine YELLOW (A) YELLOW   APPearance HAZY (A) CLEAR   Specific Gravity, Urine 1.015 1.005 - 1.030   pH 5.0 5.0 - 8.0   Glucose, UA NEGATIVE NEGATIVE mg/dL   Hgb urine dipstick NEGATIVE NEGATIVE   Bilirubin Urine NEGATIVE NEGATIVE   Ketones, ur NEGATIVE NEGATIVE mg/dL   Protein, ur NEGATIVE NEGATIVE mg/dL   Nitrite  NEGATIVE NEGATIVE   Leukocytes,Ua NEGATIVE NEGATIVE    Comment: Performed at [facility], [address] [place], [place] [address]  Lactic acid, plasma     Status: Abnormal   Collection Time: 06/08/22  7:30 PM  Result  Value Ref Range   Lactic Acid, Venous 2.2 (HH) 0.5 - 1.9 mmol/L    Comment: CRITICAL RESULT CALLED TO, READ BACK BY AND VERIFIED WITH KIM EDWARDS @2005  ON 06/07/21 SKL Performed at [facility], [address] [place], [place] [address]   Lactic acid, plasma     Status: None   Collection Time: 06/08/22  7:30 PM  Result Value Ref Range   Lactic Acid, Venous 1.7 0.5 - 1.9 mmol/L    Comment: Performed at [facility], [address] [place], [place] [address]  CBC with Differential/Platelet     Status: Abnormal   Collection Time: 06/08/22  7:30 PM  Result Value Ref Range   WBC 9.4 4.0 - 10.5 K/uL   RBC 3.92 (L) 4.22 - 5.81 MIL/uL   Hemoglobin 14.0 13.0 - 17.0 g/dL   HCT 09.8 11.9 - 14.7 %   MCV 102.3 (H) 80.0 - 100.0 fL   MCH 35.7 (H) 26.0 - 34.0 pg   MCHC 34.9 30.0 - 36.0 g/dL   RDW 82.9 56.2 - 13.0 %   Platelets 152 150 - 400 K/uL   nRBC 0.0 0.0 - 0.2 %   Neutrophils Relative % 90 %   Neutro Abs 8.4 (H) 1.7 - 7.7 K/uL   Lymphocytes Relative 3 %   Lymphs Abs 0.3 (L) 0.7 - 4.0 K/uL   Monocytes Relative 6 %   Monocytes Absolute 0.6 0.1 - 1.0 K/uL   Eosinophils Relative 1 %   Eosinophils Absolute 0.1 0.0 - 0.5 K/uL   Basophils Relative 0 %   Basophils Absolute 0.0 0.0 - 0.1 K/uL   Immature Granulocytes 0 %   Abs Immature Granulocytes 0.03 0.00 - 0.07 K/uL    Comment: Performed at [facility], [address] [place], [place] [address]    Lab Results  Component Value Date   CREATININE 0.91 06/08/2022   CREATININE 0.69 04/18/2021      Latest Ref Rng & Units 06/08/2022    4:40 PM 04/18/2021    9:38 AM  CMP  Glucose 70 - 99 mg/dL 469  629   BUN 6 - 20 mg/dL 11  15   Creatinine 5.28 - 1.24 mg/dL 4.13  2.44   Sodium  010 - 145 mmol/L 134  137   Potassium 3.5 - 5.1 mmol/L 3.7  4.0   Chloride 98 - 111 mmol/L 101  102   CO2 22 - 32 mmol/L 23  24   Calcium 8.9 - 10.3 mg/dL 9.2  8.9   Total Protein 6.5 - 8.1 g/dL 7.6  7.0   Total Bilirubin 0.3 - 1.2 mg/dL 1.3  1.0   Alkaline Phos 38 - 126 U/L 52  53   AST 15 - 41 U/L 31  23   ALT 0 - 44 U/L 42  41    Unresulted Labs (From admission, onward)     Start     Ordered   06/09/22 0500  Comprehensive metabolic panel  Tomorrow morning,   STAT        06/08/22 2017   06/09/22 0500  CBC  Tomorrow morning,   STAT        06/08/22 2017   06/08/22 2009  HIV Antibody (routine testing w rflx)  (HIV Antibody (Routine testing w reflex) panel)  Once,   URGENT        06/08/22 2017   06/08/22 1905  Culture, blood (routine x 2)  BLOOD CULTURE X 2,  STAT      06/08/22 1904   06/08/22 1905  Urine Culture  Add-on,   AD       Question:  Indication  Answer:  Sepsis   06/08/22 1905           Pt has received : Orders Placed This Encounter  Procedures   Critical Care    This order was created via procedure documentation    Standing Status:   Standing    Number of Occurrences:   1   Culture, blood (routine x 2)    Standing Status:   Standing    Number of Occurrences:   2   Urine Culture    Standing Status:   Standing    Number of Occurrences:   1    Order Specific Question:   Indication    Answer:   Sepsis   CT ABDOMEN PELVIS W CONTRAST    Standing Status:   Standing    Number of Occurrences:   1    Order Specific Question:   Does the patient have a contrast media/X-ray dye allergy?    Answer:   No    Order Specific Question:   If indicated for the ordered procedure, I authorize the administration of contrast media per Radiology protocol    Answer:   Yes    Order Specific Question:   If indicated for the ordered procedure, I authorize the administration of oral contrast media per Radiology protocol    Answer:   Yes   Lipase, blood    Standing Status:    Standing    Number of Occurrences:   1   Comprehensive metabolic panel    Standing Status:   Standing    Number of Occurrences:   1   CBC    Standing Status:   Standing    Number of Occurrences:   1   Urinalysis, Routine w reflex microscopic -Urine, Clean Catch    Standing Status:   Standing    Number of Occurrences:   1    Order Specific Question:   Specimen Source    Answer:   Urine, Clean Catch [76]   Lactic acid, plasma    Standing Status:   Standing    Number of Occurrences:   2   CBC with Differential/Platelet    Standing Status:   Standing    Number of Occurrences:   1   HIV Antibody (routine testing w rflx)    Standing Status:   Standing    Number of Occurrences:   1   Comprehensive metabolic panel    Standing Status:   Standing    Number of Occurrences:   1   CBC    Standing Status:   Standing    Number of Occurrences:   1   Diet NPO time specified Except for: Sips with Meds, Ice Chips    Standing Status:   Standing    Number of Occurrences:   1    Order Specific Question:   Except for    Answer:   Sips with Meds    Order Specific Question:   Except for    Answer:   Ice Chips   Cardiac Monitoring Continuous x 24 hours Indications for use: Other; other indications for use: monitor for ischemia    Standing Status:   Standing    Number of Occurrences:   1    Order Specific Question:   Indications for use:    Answer:  Other    Order Specific Question:   other indications for use:    Answer:   monitor for ischemia   Maintain IV access    Standing Status:   Standing    Number of Occurrences:   1   Vital signs    Standing Status:   Standing    Number of Occurrences:   1   Notify physician (specify)    Standing Status:   Standing    Number of Occurrences:   20    Order Specific Question:   Notify Physician    Answer:   for pulse less than 55 or greater than 120    Order Specific Question:   Notify Physician    Answer:   for respiratory rate less than 12 or  greater than 25    Order Specific Question:   Notify Physician    Answer:   for temperature greater than 100.5 F    Order Specific Question:   Notify Physician    Answer:   for urinary output less than 30 mL/hr for four hours    Order Specific Question:   Notify Physician    Answer:   for systolic BP less than 90 or greater than 160, diastolic BP less than 60 or greater than 100   Progressive Mobility Protocol: No Restrictions    Standing Status:   Standing    Number of Occurrences:   1   If patient diabetic or glucose greater than 140 notify physician for Sliding Scale Insulin Orders    Standing Status:   Standing    Number of Occurrences:   20   Intake and Output    Standing Status:   Standing    Number of Occurrences:   1   Do not place and if present remove PureWick    Standing Status:   Standing    Number of Occurrences:   1   Oral care per nursing protocol    Standing Status:   Standing    Number of Occurrences:   1   Initiate Oral Care Protocol    Standing Status:   Standing    Number of Occurrences:   1   Initiate Carrier Fluid Protocol    Standing Status:   Standing    Number of Occurrences:   1   Wean Patient To Room Air    Standing Status:   Standing    Number of Occurrences:   1   Check Pulse Oximetry while ambulating    Standing Status:   Standing    Number of Occurrences:   1   RN may order General Admission PRN Orders utilizing "General Admission PRN medications" (through manage orders) for the following patient needs: allergy symptoms (Claritin), cold sores (Carmex), cough (Robitussin DM), eye irritation (Liquifilm Tears), hemorrhoids (Tucks), indigestion (Maalox), minor skin irritation (Hydrocortisone Cream), muscle pain (Ben Gay), nose irritation (saline nasal spray) and sore throat (Chloraseptic spray).    Standing Status:   Standing    Number of Occurrences:   [phone]   Full code    Standing Status:   Standing    Number of Occurrences:   1    Order Specific  Question:   By:    Answer:   Other   Consult to hospitalist    Standing Status:   Standing    Number of Occurrences:   1    Order Specific Question:   Place call to:    Answer:   [id number]  Order Specific Question:   Reason for Consult    Answer:   Admit    Order Specific Question:   Diagnosis/Clinical Info for Consult:    Answer:   Microperforation of ascending colon following colonoscopy. Gen Surgery recommends abx therapy with surgery consulting   Consult to Transition of Care Team    Standing Status:   Standing    Number of Occurrences:   1    Order Specific Question:   Reason for Consult:    Answer:   Home Health / DME Needs   Pulse oximetry check with vital signs    Standing Status:   Standing    Number of Occurrences:   1   Oxygen therapy Mode or (Route): Nasal cannula; Liters Per Minute: 2; Keep 02 saturation: greater than 92 %    Notify MD if patient has a new O2 requirement    Standing Status:   Standing    Number of Occurrences:   20    Order Specific Question:   Mode or (Route)    Answer:   Nasal cannula    Order Specific Question:   Liters Per Minute    Answer:   2    Order Specific Question:   Keep 02 saturation    Answer:   greater than 92 %   Incentive spirometry    Standing Status:   Standing    Number of Occurrences:   1   Insert peripheral IV    Standing Status:   Standing    Number of Occurrences:   1   Admit to Inpatient (patient's expected length of stay will be greater than 2 midnights or inpatient only procedure)    Standing Status:   Standing    Number of Occurrences:   1    Order Specific Question:   Hospital Area    Answer:   [facility] [[id number]]    Order Specific Question:   Level of Care    Answer:   Med-Surg [16]    Order Specific Question:   Covid Evaluation    Answer:   Asymptomatic - no recent exposure (last 10 days) testing not required    Order Specific Question:   Diagnosis    Answer:   Abdominal pain [161096]     Order Specific Question:   Admitting Physician    Answer:   Darrold Junker    Order Specific Question:   Attending Physician    Answer:   Darrold Junker    Order Specific Question:   Certification:    Answer:   I certify this patient will need inpatient services for at least 2 midnights    Order Specific Question:   Estimated Length of Stay    Answer:   2   Aspiration precautions    Standing Status:   Standing    Number of Occurrences:   1   Fall precautions    Standing Status:   Standing    Number of Occurrences:   1    Meds ordered this encounter  Medications   morphine (PF) 4 MG/ML injection 4 mg   ondansetron (ZOFRAN) injection 4 mg   sodium chloride 0.9 % bolus 1,000 mL   iohexol (OMNIPAQUE) 350 MG/ML injection 100 mL   piperacillin-tazobactam (ZOSYN) IVPB 3.375 g    Order Specific Question:   Antibiotic Indication:    Answer:   Intra-abdominal Infection   acetaminophen (TYLENOL) tablet 650 mg   sodium chloride  0.9 % bolus 1,000 mL   DISCONTD: piperacillin-tazobactam (ZOSYN) IVPB 3.375 g    Order Specific Question:   Antibiotic Indication:    Answer:   Intra-abdominal Infection   metoprolol succinate (TOPROL-XL) 24 hr tablet 50 mg    Take with or immediately following a meal.     rosuvastatin (CRESTOR) tablet 10 mg   sodium chloride flush (NS) 0.9 % injection 3 mL   lactated ringers infusion   OR Linked Order Group    acetaminophen (TYLENOL) tablet 650 mg    acetaminophen (TYLENOL) suppository 650 mg   morphine (PF) 2 MG/ML injection 2 mg   hydrALAZINE (APRESOLINE) injection 5 mg   heparin injection 5,000 Units   OR Linked Order Group    ondansetron (ZOFRAN) tablet 4 mg    ondansetron (ZOFRAN) injection 4 mg   DISCONTD: losartan-hydrochlorothiazide (HYZAAR) 100-12.5 MG per tablet 1 tablet   amLODipine (NORVASC) tablet 5 mg   piperacillin-tazobactam (ZOSYN) IVPB 3.375 g    Order Specific Question:   Antibiotic Indication:    Answer:   Intra-abdominal  Infection   AND Linked Order Group    losartan (COZAAR) tablet 100 mg    hydrochlorothiazide (HYDRODIURIL) tablet 12.5 mg    Admission Imaging : CT ABDOMEN PELVIS W CONTRAST  Result Date: 06/08/2022 CLINICAL DATA:  Peritonitis or perforation suspected colonoscopy/endoscopy. Intense abd pain, distention, unable to pass stool. Right side abdominal pain. EXAM: CT ABDOMEN AND PELVIS WITH CONTRAST TECHNIQUE: Multidetector CT imaging of the abdomen and pelvis was performed using the standard protocol following bolus administration of intravenous contrast. RADIATION DOSE REDUCTION: This exam was performed according to the departmental dose-optimization program which includes automated exposure control, adjustment of the mA and/or kV according to patient size and/or use of iterative reconstruction technique. CONTRAST:  OMNIPAQUE IOHEXOL 350 MG/ML SOLN COMPARISON:  04/18/2021 FINDINGS: Lower chest: No acute abnormality Hepatobiliary: No focal hepatic abnormality. Gallbladder unremarkable. Pancreas: No focal abnormality or ductal dilatation. Spleen: No focal abnormality.  Normal size. Adrenals/Urinary Tract: No adrenal abnormality. No focal renal abnormality. No stones or hydronephrosis. Urinary bladder is unremarkable. Stomach/Bowel: There is inflammation and wall thickening in the region of the ascending colon near the hepatic flexure. A few locules of extraluminal gas noted in the area. There appear to be surgical clips in the area. This may be an area of biopsy on recent colonoscopy. Recommend clinical correlation. Remainder bowel is unremarkable. No bowel obstruction. Vascular/Lymphatic: Aortic atherosclerosis. Reproductive: Obscured by beam hardening artifact from bilateral hip replacements. Other: No free fluid or free air. Musculoskeletal: Bilateral hip replacements. No acute bony abnormality. IMPRESSION: Wall thickening, inflammation and extraluminal gas associated with the ascending colon near the  hepatic flexure. This may be in area of biopsy on recent colonoscopy, compatible with perforation. No free intraperitoneal air elsewhere within the abdomen or pelvis. These results were called by telephone at the time of interpretation on 06/08/2022 at 6:12 pm to provider [facility] , who verbally acknowledged these results. Electronically Signed   By: Charlett Nose M.D.   On: 06/08/2022 18:14   Physical Examination: Vitals:   06/08/22 2201 06/08/22 2332 06/09/22 0035 06/09/22 0132  BP: 110/73 (!) 112/57 99/64 104/66  Pulse: (!) 116 (!) 117 (!) 120 (!) 114  Temp: 99.3 F (37.4 C) (!) 101.6 F (38.7 C) Comment: I notified the nurse Christin. (!) 102.2 F (39 C) (!) 100.8 F (38.2 C)  Resp: 20 20 20 20   Height:      Weight:  SpO2: 96% 94% 92% 91%  TempSrc: Oral Oral  Oral  BMI (Calculated):       Physical Exam Vitals and nursing note reviewed.  Constitutional:      General: He is not in acute distress.    Appearance: Normal appearance. He is not ill-appearing, toxic-appearing or diaphoretic.  HENT:     Head: Normocephalic and atraumatic.     Right Ear: Hearing and external ear normal.     Left Ear: Hearing and external ear normal.     Nose: Nose normal. No nasal deformity.     Mouth/Throat:     Lips: Pink.     Mouth: Mucous membranes are moist.     Tongue: No lesions.     Pharynx: Oropharynx is clear.  Eyes:     Extraocular Movements: Extraocular movements intact.  Cardiovascular:     Rate and Rhythm: Normal rate and regular rhythm.     Pulses: Normal pulses.     Heart sounds: Normal heart sounds.  Pulmonary:     Effort: Pulmonary effort is normal.     Breath sounds: Normal breath sounds.  Abdominal:     General: Bowel sounds are absent. There is distension.     Palpations: There is no mass.     Tenderness: There is generalized abdominal tenderness. There is guarding.     Hernia: No hernia is present.  Musculoskeletal:     Right lower leg: No edema.     Left  lower leg: No edema.  Skin:    General: Skin is warm.  Neurological:     General: No focal deficit present.     Mental Status: He is alert and oriented to person, place, and time.     Cranial Nerves: Cranial nerves 2-12 are intact.     Motor: Motor function is intact.  Psychiatric:        Attention and Perception: Attention normal.        Mood and Affect: Mood normal.        Speech: Speech normal.        Behavior: Behavior normal. Behavior is cooperative.        Cognition and Memory: Cognition normal.   Attribute to perforation Assessment and Plan: * Abdominal pain In seen on CT scan with wall thickening inflammation and extraluminal gas in the ascending colon near the hepatic flexure. Will continue patient on IV fluids, n.p.o., IV antibiotic regimen. General surgery Dr. Claudine Mouton consulted by ED provider.   Obstructive sleep apnea (adult)   CPAP per home settings.   Essential hypertension Vitals:   06/08/22 1641 06/08/22 1727 06/08/22 1833 06/08/22 2201  BP: (!) 157/82 (!) 162/83 (!) 169/97 110/73   06/08/22 2332 06/09/22 0035 06/09/22 0132  BP: (!) 112/57 99/64 104/66  We resumed patient's blood pressure medications with amlodipine, losartan, metoprolol, HCTZ.     DVT prophylaxis:  Heparin Code Status:  Full code    06/08/2022    9:50 PM  Advanced Directives  Does Patient Have a Medical Advance Directive? Yes  Type of Estate agent of [place];Living will  Does patient want to make changes to medical advance directive? No - Patient declined  Copy of Healthcare Power of Attorney in Chart? No - copy requested    Family Communication:  Robyn wife at bedside Emergency Contact: Contact Information     Name Relation Home Work Mobile   [place] Spouse [phone]  [phone]   [place],Carrielynn Daughter [phone]  Disposition Plan:  Home Consults: General surgery: Dr. Dolores Frame Admission status: Inpatient Unit /  Expected LOS: Med/tele/2 days.  Gertha Calkin MD [facility]  6 PM- 2 AM. [phone]( Pager )  For questions regarding this patient please use [email or web address] to contact the current [facility] MD.   Bonita Quin may also call [phone] to contact current Assigned [facility] Attending/Consulting MD for this patient.

## 2022-06-08 NOTE — Hospital Course (Signed)
Abd pain. RUQ to RLQ. C-scopy/ egd and polyps removed. Abd pain since procedure. Distention. BS hypoactive. Imaging shows : Fever/ tachy cardiac. Microperforation. CLIP in colon. Radiologist.called: Pericolonic stranding and gas.  Gen surg abx and consult.  Blood cultures. IV PPI. NPO. Lactic baseline.  EGD: Negative.  Sepsis/ severe.

## 2022-06-08 NOTE — Op Note (Signed)
[facility] Patient Name: Trevor Castillo Procedure Date: 06/08/2022 7:43 AM MRN: [id number] Account #: [id number] Date of Birth: 1965/02/24 Admit Type: Ambulatory Age: 57 Room: [facility] ENDO ROOM 2 Gender: Male Note Status: Finalized Instrument Name: Prentice Docker [id number] Procedure:             Colonoscopy Indications:           High risk colon cancer surveillance: Personal history                         of multiple (3 or more) adenomas Providers:             Boykin Nearing. Norma Fredrickson MD, MD Referring MD:          Doran Stabler, MD (Referring MD) Medicines:             Propofol per Anesthesia Complications:         No immediate complications. Estimated blood loss: None. Procedure:             Pre-Anesthesia Assessment:                        - The risks and benefits of the procedure and the                         sedation options and risks were discussed with the                         patient. All questions were answered and informed                         consent was obtained.                        - Patient identification and proposed procedure were                         verified prior to the procedure by the nurse. The                         procedure was verified in the procedure room.                        - ASA Grade Assessment: III - A patient with severe                         systemic disease.                        - After reviewing the risks and benefits, the patient                         was deemed in satisfactory condition to undergo the                         procedure.                        After obtaining informed consent, the colonoscope was  passed under direct vision. Throughout the procedure,                         the patient's blood pressure, pulse, and oxygen                         saturations were monitored continuously. The                         Colonoscope was introduced through the anus and                          advanced to the the cecum, identified by appendiceal                         orifice and ileocecal valve. The colonoscopy was                         performed without difficulty. The patient tolerated                         the procedure well. The quality of the bowel                         preparation was adequate. The ileocecal valve,                         appendiceal orifice, and rectum were photographed. Findings:      The perianal and digital rectal examinations were normal. Pertinent       negatives include normal sphincter tone, no palpable rectal lesions and       normal prostate (size, shape, and consistency).      Many medium-mouthed and small-mouthed diverticula were found in the       entire colon. There was no evidence of diverticular bleeding.      Non-bleeding internal hemorrhoids were found during retroflexion. The       hemorrhoids were Grade I (internal hemorrhoids that do not prolapse).      A 20 mm polyp was found in the ascending colon. The polyp was flat,       sessile, umbilicated, Paris classification Is (protruding, sessile),       Paris classification IIc (superficial shallow, depressed) and Paris       classification mixed IIa + IIc (superficial elevation with central       depression). The polyp was removed with a piecemeal technique using a       hot snare. Polyp resection was incomplete. The resected tissue was       retrieved. Coagulation for tissue destruction using argon plasma at 2       liters/minute and 20 watts was successful. To prevent bleeding after the       polypectomy, three hemostatic clips were successfully placed (MR       conditional). Clip manufacturer: [employer]. There was no       bleeding during, or at the end, of the procedure. Area was tattooed with       an injection of Spot (carbon black). Estimated blood loss: none.      The exam was otherwise without abnormality. Impression:            - No  specimens collected. Recommendation:        - Patient has a contact number available for                         emergencies. The signs and symptoms of potential                         delayed complications were discussed with the patient.                         Return to normal activities tomorrow. Written                         discharge instructions were provided to the patient.                        - Resume previous diet.                        - Continue present medications.                        - Repeat colonoscopy is recommended for surveillance.                         The colonoscopy date will be determined after                         pathology results from today's exam become available                         for review.                        - Return to GI office in 3 months.                        - Telephone GI office to schedule appointment in 3                         months.                        - The findings and recommendations were discussed with                         the patient. Procedure Code(s):     --- Professional ---                        [id number], Colonoscopy, flexible; with ablation of                         tumor(s), polyp(s), or other lesion(s) (includes pre-                         and post-dilation and guide wire passage, when                         performed)  16109, Colonoscopy, flexible; with directed submucosal                         injection(s), any substance Diagnosis Code(s):     --- Professional ---                        Z86.010, Personal history of colonic polyps CPT copyright 2022 [employer]. All rights reserved. The codes documented in this report are preliminary and upon coder review may  be revised to meet current compliance requirements. Stanton Kidney MD, MD 06/08/2022 9:06:00 AM This report has been signed electronically. Number of Addenda: 0 Note Initiated On: 06/08/2022 7:43  AM Scope Withdrawal Time: 0 hours 21 minutes 1 second  Total Procedure Duration: 0 hours 23 minutes 21 seconds  Estimated Blood Loss:  Estimated blood loss: none. Estimated blood loss: none.      [facility]

## 2022-06-08 NOTE — H&P (Signed)
Outpatient short stay form Pre-procedure 06/08/2022 8:11 AM Trevor Castillo K. Norma Fredrickson, M.D.  Primary Physician: [name], M.D.  Reason for visit:  GERD, personal history oof colon polyps  History of present illness:  GERD-had diffuse esophageal distal wall thickening related to reflux on CT March 2023. He is on omeprazole 20 mg daily. Is feeling nocturnal reflux symptoms and we discussed these likely are due to 2-3 alcoholic drinks in the evening and recommended trying to decrease alcohol intake. Discussed potentially increasing PPI but he would like to hold off & continue  daily. No prior EGD. Will set up EGD at time of colonoscopy for further evaluation.  2. Personal history of colon polyps-due for repeat colonoscopy.     Current Facility-Administered Medications:    0.9 %  sodium chloride infusion, , Intravenous, Continuous, Brodyn Depuy, Boykin Nearing, MD  Medications Prior to Admission  Medication Sig Dispense Refill Last Dose   amLODipine (NORVASC) 5 MG tablet Take 5 mg by mouth daily.   06/08/2022 at 0315   ivabradine (CORLANOR) 7.5 MG TABS tablet Take tablets ( ) TWO hours prior to your cardiac CT scan. 2 tablet 0 06/08/2022 at 0315   losartan-hydrochlorothiazide (HYZAAR) 100-12.5 MG tablet Take by mouth.   06/08/2022 at 0315   metoprolol succinate (TOPROL-XL) 50 MG 24 hr tablet Take 50 mg by mouth daily. Take with or immediately following a meal.   06/08/2022 at 0315   omeprazole (PRILOSEC) 20 MG capsule Take by mouth.   06/08/2022 at 0315   rosuvastatin (CRESTOR) 10 MG tablet Take 10 mg by mouth daily.   06/08/2022 at 0315   LORazepam (ATIVAN) 0.5 MG tablet Take 1 tablet by mouth 2 (two) times daily as needed.      Misc. Devices MISC by Does not apply route. cpap device      Multiple Vitamin (MULTI-VITAMIN) tablet Take 1 tablet by mouth daily.      naproxen sodium (ALEVE) 220 MG tablet Take 220 mg by mouth.        Allergies  Allergen Reactions   Gramineae Pollens Other (See Comments)     Itchy, watery eyes and sneezing      Past Medical History:  Diagnosis Date   Diverticulitis    Hypertension    Sleep apnea     Review of systems:  Otherwise negative.    Physical Exam  Gen: Alert, oriented. Appears stated age.  HEENT: [place]/AT. PERRLA. Lungs: CTA, no wheezes. CV: RR nl S1, S2. Abd: soft, benign, no masses. BS+ Ext: No edema. Pulses 2+    Planned procedures: Proceed with EGD and colonoscopy. The patient understands the nature of the planned procedure, indications, risks, alternatives and potential complications including but not limited to bleeding, infection, perforation, damage to internal organs and possible oversedation/side effects from anesthesia. The patient agrees and gives consent to proceed.  Please refer to procedure notes for findings, recommendations and patient disposition/instructions.     [name] K. Norma Fredrickson, M.D. Gastroenterology 06/08/2022  8:11 AM

## 2022-06-08 NOTE — ED Provider Notes (Signed)
[facility] Provider Note  Patient Contact: 6:36 PM (approximate)   History   Abdominal Pain   HPI  Trevor Castillo is a 57 y.o. male complaining of right upper upper quadrant and right abdominal pain following a colonoscopy and endoscopy this morning.  Patient had procedure with no complications.  Went home, started to have worsening abdominal pain and distention of the abdomen.  Patient has not passed gas or had a bowel movement since the colonoscopy.  No fevers on arrival but patient did develop a fever while waiting for imaging.  Pain is sharp, nonradiating.  Denies emesis, diarrhea.  Again has not passed gas or stool since colonoscopy.  No urinary changes.     Physical Exam   Triage Vital Signs: ED Triage Vitals  Enc Vitals Group     BP 06/08/22 1641 (!) 157/82     Pulse Rate 06/08/22 1641 97     Resp 06/08/22 1641 18     Temp 06/08/22 1641 98.8 F (37.1 C)     Temp Source 06/08/22 1641 Oral     SpO2 06/08/22 1641 98 %     Weight --      Height --      Head Circumference --      Peak Flow --      Pain Score 06/08/22 1638 10     Pain Loc --      Pain Edu? --      Excl. in GC? --     Most recent vital signs: Vitals:   06/08/22 2033 06/08/22 2201  BP:  110/73  Pulse:  (!) 116  Resp:  20  Temp: (!) 103 F (39.4 C) 99.3 F (37.4 C)  SpO2:  96%     General: Alert and in no acute distress.  Cardiovascular:  Good peripheral perfusion Respiratory: Normal respiratory effort without tachypnea or retractions. Lungs CTAB. Good air entry to the bases with no decreased or absent breath sounds. Gastrointestinal: Bowel sounds 4 quadrants.  Firm to palpation.  Distention is visible.  Tender to palpation along the right upper and right middle abdomen.  No other significant tenderness to palpation.  Guarding in this area.  No palpable masses. No distention. No CVA tenderness. Musculoskeletal: Full range of motion to all extremities.  Neurologic:  No  gross focal neurologic deficits are appreciated.  Skin:   No rash noted Other:   ED Results / Procedures / Treatments   Labs (all labs ordered are listed, but only abnormal results are displayed) Labs Reviewed  COMPREHENSIVE METABOLIC PANEL - Abnormal; Notable for the following components:      Result Value   Sodium 134 (*)    Glucose, Bld 110 (*)    Total Bilirubin 1.3 (*)    All other components within normal limits  CBC - Abnormal; Notable for the following components:   MCV 102.5 (*)    MCH 35.4 (*)    All other components within normal limits  URINALYSIS, ROUTINE W REFLEX MICROSCOPIC - Abnormal; Notable for the following components:   Color, Urine YELLOW (*)    APPearance HAZY (*)    All other components within normal limits  LACTIC ACID, PLASMA - Abnormal; Notable for the following components:   Lactic Acid, Venous 2.2 (*)    All other components within normal limits  CBC WITH DIFFERENTIAL/PLATELET - Abnormal; Notable for the following components:   RBC 3.92 (*)    MCV 102.3 (*)    [facility]  35.7 (*)    Neutro Abs 8.4 (*)    Lymphs Abs 0.3 (*)    All other components within normal limits  CULTURE, BLOOD (ROUTINE X 2)  CULTURE, BLOOD (ROUTINE X 2)  URINE CULTURE  LIPASE, BLOOD  LACTIC ACID, PLASMA  HIV ANTIBODY (ROUTINE TESTING W REFLEX)  COMPREHENSIVE METABOLIC PANEL  CBC     EKG     RADIOLOGY  I personally viewed, evaluated, and interpreted these images as part of my medical decision making, as well as reviewing the written report by the radiologist.  ED Provider Interpretation: I discussed the findings with the radiologist via telephone.  Patient has colonic wall thickening, pericolonic stranding with small amount of extraluminal free air.  No evidence of free fluid or contained abscess.  This occurs in the ascending colon near the hepatic flexure.  No other significant findings on CT scan.  CT ABDOMEN PELVIS W CONTRAST  Result Date: 06/08/2022 CLINICAL  DATA:  Peritonitis or perforation suspected colonoscopy/endoscopy. Intense abd pain, distention, unable to pass stool. Right side abdominal pain. EXAM: CT ABDOMEN AND PELVIS WITH CONTRAST TECHNIQUE: Multidetector CT imaging of the abdomen and pelvis was performed using the standard protocol following bolus administration of intravenous contrast. RADIATION DOSE REDUCTION: This exam was performed according to the departmental dose-optimization program which includes automated exposure control, adjustment of the mA and/or kV according to patient size and/or use of iterative reconstruction technique. CONTRAST:  OMNIPAQUE IOHEXOL 350 MG/ML SOLN COMPARISON:  04/18/2021 FINDINGS: Lower chest: No acute abnormality Hepatobiliary: No focal hepatic abnormality. Gallbladder unremarkable. Pancreas: No focal abnormality or ductal dilatation. Spleen: No focal abnormality.  Normal size. Adrenals/Urinary Tract: No adrenal abnormality. No focal renal abnormality. No stones or hydronephrosis. Urinary bladder is unremarkable. Stomach/Bowel: There is inflammation and wall thickening in the region of the ascending colon near the hepatic flexure. A few locules of extraluminal gas noted in the area. There appear to be surgical clips in the area. This may be an area of biopsy on recent colonoscopy. Recommend clinical correlation. Remainder bowel is unremarkable. No bowel obstruction. Vascular/Lymphatic: Aortic atherosclerosis. Reproductive: Obscured by beam hardening artifact from bilateral hip replacements. Other: No free fluid or free air. Musculoskeletal: Bilateral hip replacements. No acute bony abnormality. IMPRESSION: Wall thickening, inflammation and extraluminal gas associated with the ascending colon near the hepatic flexure. This may be in area of biopsy on recent colonoscopy, compatible with perforation. No free intraperitoneal air elsewhere within the abdomen or pelvis. These results were called by telephone at the time of  interpretation on 06/08/2022 at 6:12 pm to provider [facility] , who verbally acknowledged these results. Electronically Signed   By: Charlett Nose M.D.   On: 06/08/2022 18:14    PROCEDURES:  Critical Care performed: Yes, see critical care procedure note(s)  .Critical Care E&M  Performed by: Racheal Patches, PA-C Critical care provider statement:    Critical care time (minutes):  53   Critical care time was exclusive of:  Separately billable procedures and treating other patients and teaching time   Critical care was necessary to treat or prevent imminent or life-threatening deterioration of the following conditions:  Sepsis   Critical care was time spent personally by me on the following activities:  Obtaining history from patient or surrogate, examination of patient, evaluation of patient's response to treatment, discussions with consultants, development of treatment plan with patient or surrogate, ordering and performing treatments and interventions, ordering and review of laboratory studies, ordering and review of radiographic  studies, re-evaluation of patient's condition and review of old charts   I assumed direction of critical care for this patient from another provider in my specialty: no     Care discussed with: admitting provider   After initial E/M assessment, critical care services were subsequently performed that were exclusive of separately billable procedures or treatment.      MEDICATIONS ORDERED IN ED: Medications  metoprolol succinate (TOPROL-XL) 24 hr tablet 50 mg (has no administration in time range)  rosuvastatin (CRESTOR) tablet 10 mg (has no administration in time range)  sodium chloride flush (NS) 0.9 % injection 3 mL (3 mLs Intravenous Given 06/08/22 2102)  lactated ringers infusion ( Intravenous New Bag/Given 06/08/22 2102)  acetaminophen (TYLENOL) tablet 650 mg (has no administration in time range)    Or  acetaminophen (TYLENOL) suppository 650 mg  (has no administration in time range)  morphine (PF) 2 MG/ML injection 2 mg (2 mg Intravenous Given 06/08/22 2045)  hydrALAZINE (APRESOLINE) injection 5 mg (has no administration in time range)  heparin injection 5,000 Units (5,000 Units Subcutaneous Given 06/08/22 2105)  ondansetron (ZOFRAN) tablet 4 mg (has no administration in time range)    Or  ondansetron (ZOFRAN) injection 4 mg (has no administration in time range)  amLODipine (NORVASC) tablet 5 mg (5 mg Oral Not Given 06/08/22 2034)  piperacillin-tazobactam (ZOSYN) IVPB 3.375 g (has no administration in time range)  losartan (COZAAR) tablet 100 mg (has no administration in time range)    And  hydrochlorothiazide (HYDRODIURIL) tablet 12.5 mg (has no administration in time range)  morphine (PF) 4 MG/ML injection 4 mg (4 mg Intravenous Given 06/08/22 1718)  ondansetron (ZOFRAN) injection 4 mg (4 mg Intravenous Given 06/08/22 1713)  sodium chloride 0.9 % bolus 1,000 mL (0 mLs Intravenous Stopped 06/08/22 1832)  iohexol (OMNIPAQUE) 350 MG/ML injection 100 mL (100 mLs Intravenous Contrast Given 06/08/22 1742)  piperacillin-tazobactam (ZOSYN) IVPB 3.375 g (0 g Intravenous Stopped 06/08/22 1859)  acetaminophen (TYLENOL) tablet 650 mg (650 mg Oral Given 06/08/22 1828)  sodium chloride 0.9 % bolus 1,000 mL (0 mLs Intravenous Stopped 06/08/22 2036)     IMPRESSION / MDM / ASSESSMENT AND PLAN / ED COURSE  I reviewed the triage vital signs and the nursing notes.                                 Differential diagnosis includes, but is not limited to, bowel perforation, intestinal blockage, colitis   Patient's presentation is most consistent with acute presentation with potential threat to life or bodily function.   Patient's diagnosis is consistent with colonic perforation.  Patient presents to the emergency department complaining of right sided abdominal pain, inability to pass gas and stool following a colonoscopy and endoscopy earlier today.   Patient did have colpectomy performed during his endoscopy.  Patient was very tender on the right side.  Initially had no fever but ultimately developed a fever and elevated heart rate.  Patient was unable to pass gas and stool.  CT scan revealed pericolonic stranding, bowel wall inflammation and extraluminal gas concerning for colonic perforation.  No free fluid and no evidence of drainable abscess.  The patient is started on antibiotics, given fluids and pain medication.  Will admit to the hospitalist service after I discussed the findings with general surgery.  General surgery recommends at this time that this is likely not a surgical intervention unless patient is worsening, develops extraluminal  fluid or findings concerning for abscess.  Will reach out to the hospitalist team for admission at this time.Marland Kitchen     FINAL CLINICAL IMPRESSION(S) / ED DIAGNOSES   Final diagnoses:  Bowel perforation     Rx / DC Orders   ED Discharge Orders     None        Note:  This document was prepared using Dragon voice recognition software and may include unintentional dictation errors.   Lanette Hampshire 06/08/22 2306    Corena Herter, MD 06/08/22 [phone]

## 2022-06-08 NOTE — ED Triage Notes (Signed)
Patient to ED for right sided abd pain. Had endoscopy this AM w/ Dr Deborah Chalk. No BM after endo. Patient appears in discomfort.

## 2022-06-09 ENCOUNTER — Encounter: Payer: Self-pay | Admitting: Internal Medicine

## 2022-06-09 DIAGNOSIS — K631 Perforation of intestine (nontraumatic): Secondary | ICD-10-CM | POA: Diagnosis not present

## 2022-06-09 DIAGNOSIS — R6520 Severe sepsis without septic shock: Secondary | ICD-10-CM

## 2022-06-09 DIAGNOSIS — A419 Sepsis, unspecified organism: Secondary | ICD-10-CM | POA: Diagnosis not present

## 2022-06-09 LAB — CBC
HCT: 40.4 % (ref 39.0–52.0)
Hemoglobin: 13.8 g/dL (ref 13.0–17.0)
MCH: 35.3 pg — ABNORMAL HIGH (ref 26.0–34.0)
MCHC: 34.2 g/dL (ref 30.0–36.0)
MCV: 103.3 fL — ABNORMAL HIGH (ref 80.0–100.0)
Platelets: 156 10*3/uL (ref 150–400)
RBC: 3.91 MIL/uL — ABNORMAL LOW (ref 4.22–5.81)
RDW: 12.4 % (ref 11.5–15.5)
WBC: 16.8 10*3/uL — ABNORMAL HIGH (ref 4.0–10.5)
nRBC: 0 % (ref 0.0–0.2)

## 2022-06-09 LAB — COMPREHENSIVE METABOLIC PANEL
ALT: 34 U/L (ref 0–44)
AST: 25 U/L (ref 15–41)
Albumin: 3.3 g/dL — ABNORMAL LOW (ref 3.5–5.0)
Alkaline Phosphatase: 36 U/L — ABNORMAL LOW (ref 38–126)
Anion gap: 9 (ref 5–15)
BUN: 14 mg/dL (ref 6–20)
CO2: 22 mmol/L (ref 22–32)
Calcium: 8.5 mg/dL — ABNORMAL LOW (ref 8.9–10.3)
Chloride: 106 mmol/L (ref 98–111)
Creatinine, Ser: 1.02 mg/dL (ref 0.61–1.24)
GFR, Estimated: >60 mL/min (ref >60)
Glucose, Bld: 128 mg/dL — ABNORMAL HIGH (ref 70–99)
Potassium: 3.6 mmol/L (ref 3.5–5.1)
Sodium: 137 mmol/L (ref 135–145)
Total Bilirubin: 2.3 mg/dL — ABNORMAL HIGH (ref 0.3–1.2)
Total Protein: 6.4 g/dL — ABNORMAL LOW (ref 6.5–8.1)

## 2022-06-09 LAB — CULTURE, BLOOD (ROUTINE X 2): Special Requests: ADEQUATE

## 2022-06-09 LAB — HIV ANTIBODY (ROUTINE TESTING W REFLEX): HIV Screen 4th Generation wRfx: NONREACTIVE

## 2022-06-09 LAB — SURGICAL PATHOLOGY

## 2022-06-09 NOTE — Assessment & Plan Note (Signed)
In seen on CT scan with wall thickening inflammation and extraluminal gas in the ascending colon near the hepatic flexure. Will continue patient on IV fluids, n.p.o., IV antibiotic regimen. General surgery Dr. Claudine Mouton consulted by ED provider.

## 2022-06-09 NOTE — Evaluation (Signed)
Physical Therapy Evaluation Patient Details Name: Trevor Castillo MRN: [id number] DOB: January 23, 1966 Today's Date: 06/09/2022  History of Present Illness  Patient is a 57 y/o male who presented yesterday afternoon to the ED  d/t severe abdominal pain he experienced several hours after colonoscopy with polypectomy that was performed yesterday AM (06/08/22).  Patient was found to have moderate leukocytosis, inability to pass flatus or BM, and a CT that showed wall thickening in the area of recent polypectomy along with some small amount of extraluminal gas without free intraperitoneal air noted by the radiologist.  Clinical appearance appeared to be of microperforation of the bowel related to polypectomy   Clinical Impression  Patient received in bed, he is agreeable to PT assessment. Patient requires cues for rolling in bed to sit up due to abdominal pain, min A needed to return to supine from sitting. Patient is able to stand with supervision. Ambulated 175 feet without AD, supervision. Patient will continue to benefit from skilled PT to ensure independence with mobility.          Recommendations for follow up therapy are one component of a multi-disciplinary discharge planning process, led by the attending physician.  Recommendations may be updated based on patient status, additional functional criteria and insurance authorization.  Follow Up Recommendations       Assistance Recommended at Discharge PRN  Patient can return home with the following  Help with stairs or ramp for entrance    Equipment Recommendations None recommended by PT  Recommendations for Other Services       Functional Status Assessment Patient has had a recent decline in their functional status and demonstrates the ability to make significant improvements in function in a reasonable and predictable amount of time.     Precautions / Restrictions Restrictions Weight Bearing Restrictions: No      Mobility  Bed  Mobility Overal bed mobility: Needs Assistance Bed Mobility: Sit to Supine       Sit to supine: Min guard   General bed mobility comments: some difficulty due to pain, use of bed rails, min A to bring Legs back up onto bed    Transfers Overall transfer level: Independent Equipment used: None               General transfer comment: stands from elevated bed as patient is 6'7"    Ambulation/Gait Ambulation/Gait assistance: Supervision Gait Distance (Feet): 175 Feet Assistive device: None Gait Pattern/deviations: Step-through pattern Gait velocity: WFL     General Gait Details: no lob or difficulty walking. Limited by pain  Stairs            Wheelchair Mobility    Modified Rankin (Stroke Patients Only)       Balance Overall balance assessment: Independent                                           Pertinent Vitals/Pain Pain Assessment Pain Assessment: Faces Faces Pain Scale: Hurts little more Pain Location: R UQ Pain Descriptors / Indicators: Discomfort, Grimacing, Guarding Pain Intervention(s): Monitored during session    Home Living Family/patient expects to be discharged to:: Private residence Living Arrangements: Spouse/significant other Available Help at Discharge: Family             Home Equipment: None      Prior Function Prior Level of Function : Independent/Modified Independent;Driving  Mobility Comments: independent ADLs Comments: independent     Hand Dominance        Extremity/Trunk Assessment   Upper Extremity Assessment Upper Extremity Assessment: Overall WFL for tasks assessed    Lower Extremity Assessment Lower Extremity Assessment: Overall WFL for tasks assessed    Cervical / Trunk Assessment Cervical / Trunk Assessment: Normal  Communication      Cognition Arousal/Alertness: Awake/alert Behavior During Therapy: WFL for tasks assessed/performed Overall Cognitive Status:  Within Functional Limits for tasks assessed                                          General Comments      Exercises     Assessment/Plan    PT Assessment Patient needs continued PT services  PT Problem List Decreased activity tolerance;Decreased mobility;Pain       PT Treatment Interventions Functional mobility training;Patient/family education;Gait training;Stair training    PT Goals (Current goals can be found in the Care Plan section)  Acute Rehab PT Goals Patient Stated Goal: to decrease pain, go home PT Goal Formulation: With patient Time For Goal Achievement: 06/23/22 Potential to Achieve Goals: Good    Frequency Min 2X/week     Co-evaluation               AM-PAC PT "6 Clicks" Mobility  Outcome Measure Help needed turning from your back to your side while in a flat bed without using bedrails?: A Little Help needed moving from lying on your back to sitting on the side of a flat bed without using bedrails?: A Little Help needed moving to and from a bed to a chair (including a wheelchair)?: None Help needed standing up from a chair using your arms (e.g., wheelchair or bedside chair)?: None Help needed to walk in hospital room?: None Help needed climbing 3-5 steps with a railing? : None 6 Click Score: 22    End of Session   Activity Tolerance: Patient tolerated treatment well Patient left: in bed Nurse Communication: Mobility status PT Visit Diagnosis: Other abnormalities of gait and mobility (R26.89);Pain Pain - Right/Left: Right Pain - part of body:  (abdomen)    Time: 3244-0102 PT Time Calculation (min) (ACUTE ONLY): 12 min   Charges:   PT Evaluation $PT Eval Low Complexity: 1 Low          Kindra Bickham, PT, GCS 06/09/22,12:54 PM

## 2022-06-09 NOTE — TOC CM/SW Note (Addendum)
CSW acknowledges consult for home health/DME needs. Sent secure chat to MD requesting PT and OT consults if it is felt that patient will need home health and/or DME.  Charlynn Court, CSW [phone]  2:48 pm: No PT or DME needs at this time.  Charlynn Court, CSW [phone]

## 2022-06-09 NOTE — Assessment & Plan Note (Signed)
CPAP per home settings.  

## 2022-06-09 NOTE — Assessment & Plan Note (Signed)
Vitals:   06/08/22 1641 06/08/22 1727 06/08/22 1833 06/08/22 2201  BP: (!) 157/82 (!) 162/83 (!) 169/97 110/73   06/08/22 2332 06/09/22 0035 06/09/22 0132  BP: (!) 112/57 99/64 104/66  We resumed patient's blood pressure medications with amlodipine, losartan, metoprolol, HCTZ.

## 2022-06-09 NOTE — Progress Notes (Signed)
Telemetry called to report tachycardia 130-136.  Patient had ambulated to restroom, had bowel movement and stated he was in pain.  Morphine given and will recheck. Patient states no other issues except abdominal pain.

## 2022-06-09 NOTE — Consult Note (Signed)
[facility] SURGICAL CONSULTATION NOTE (initial) - cpt: 16109  HISTORY OF PRESENT ILLNESS (HPI):  57 y.o. male presented to [facility] ED yesterday for evaluation of abdominal pain. Patient underwent endoscopy and colonoscopy yesterday (04/24) with Dr Norma Fredrickson for colon cancer screening. Colonoscopy reviewed and show 20 mm polyp in the ascending colon which was removed in pieces with hot snare. No pathology available for this. Patient was discharged home. Unfortunately, patient developed worsening RUQ abdominal pain and distension at home which prompted return to the ED. No nausea, emesis. He is not passing gas. In the ED he did develop fever with T-max 103.11F at 2030. He is afebrile this AM. Work up in the ED revealed a normal WBC at 10.4K (however, this is 16.8K this AM), renal function normal with sCr - 0.91, and venous lactic was initially 2.2 but normalized to 1.7. CT Abdomen/Pelvis was obtained concerning for inflammation of the right colon/hepatic flexure with contained extraluminal air. Patient was admitted to the medicine service and started on Zosyn. He is currently NPO.   This morning., patient reports he continues to have RUQ abdominal pain but this is improved compared to his presentation.   Surgery is consulted by emergency medicine provider Jonathon Cuthriell, PA-C in this context for evaluation and management of intragenic colon injury following colonoscopy.  PAST MEDICAL HISTORY (PMH):  Past Medical History:  Diagnosis Date   Diverticulitis    Hypertension    Sleep apnea      PAST SURGICAL HISTORY (PSH):  Past Surgical History:  Procedure Laterality Date   ANKLE SURGERY Right    HIP SURGERY     JOINT REPLACEMENT       MEDICATIONS:  Prior to Admission medications   Medication Sig Start Date End Date Taking? Authorizing Provider  amLODipine (NORVASC) 5 MG tablet Take 5 mg by mouth daily.   Yes [provider]  LORazepam (ATIVAN) 0.5 MG tablet Take 1  tablet by mouth 2 (two) times daily as needed. 02/23/21  Yes [provider]  losartan-hydrochlorothiazide (HYZAAR) 100-12.5 MG tablet Take 1 tablet by mouth daily. 10/08/21 10/03/22 Yes [provider]  metoprolol succinate (TOPROL-XL) 50 MG 24 hr tablet Take 50 mg by mouth daily. Take with or immediately following a meal.   Yes [provider]  Multiple Vitamin (MULTI-VITAMIN) tablet Take 1 tablet by mouth daily.   Yes [provider]  naproxen sodium (ALEVE) 220 MG tablet Take 220 mg by mouth.   Yes [provider]  omeprazole (PRILOSEC) 20 MG capsule Take 20 mg by mouth daily. 04/07/22  Yes [provider]  rosuvastatin (CRESTOR) 10 MG tablet Take 10 mg by mouth daily.   Yes [provider]  ivabradine (CORLANOR) 7.5 MG TABS tablet Take tablets (15mg ) TWO hours prior to your cardiac CT scan. Patient not taking: Reported on 06/08/2022 03/31/22   Debbe Odea, MD  Misc. Devices MISC by Does not apply route. cpap device    [provider]     ALLERGIES:  Allergies  Allergen Reactions   Gramineae Pollens Other (See Comments)    Itchy, watery eyes and sneezing      SOCIAL HISTORY:  Social History   Socioeconomic History   Marital status: Married    Spouse name: Not on file   Number of children: Not on file   Years of education: Not on file   Highest education level: Not on file  Occupational History   Not on file  Tobacco Use  Smoking status: Former    Types: Cigarettes    Quit date: 05/15/2018    Years since quitting: 4.0   Smokeless tobacco: Former    Types: Associate Professor Use: Every day   Substances: Nicotine, Flavoring  Substance and Sexual Activity   Alcohol use: Yes    Alcohol/week: 14.0 standard drinks of alcohol    Types: 14 Shots of liquor per week   Drug use: No   Sexual activity: Not on file  Other Topics Concern   Not on file  Social History Narrative   Not on file   Social  Determinants of Health   Financial Resource Strain: Not on file  Food Insecurity: No Food Insecurity (06/09/2022)   Hunger Vital Sign    Worried About Running Out of Food in the Last Year: Never true    Ran Out of Food in the Last Year: Never true  Transportation Needs: No Transportation Needs (06/09/2022)   PRAPARE - Administrator, Civil Service (Medical): No    Lack of Transportation (Non-Medical): No  Physical Activity: Not on file  Stress: Not on file  Social Connections: Not on file  Intimate Partner Violence: Not At Risk (06/09/2022)   Humiliation, Afraid, Rape, and Kick questionnaire    Fear of Current or Ex-Partner: No    Emotionally Abused: No    Physically Abused: No    Sexually Abused: No     FAMILY HISTORY:  Family History  Problem Relation Age of Onset   Diabetes Mother    Kidney disease Father    Rheum arthritis Sister    Heart attack Brother       REVIEW OF SYSTEMS:  Review of Systems  Constitutional:  Positive for fever. Negative for chills.  Respiratory:  Negative for cough and shortness of breath.   Cardiovascular:  Negative for chest pain and palpitations.  Gastrointestinal:  Positive for abdominal pain. Negative for diarrhea, nausea and vomiting.  Genitourinary:  Negative for dysuria and urgency.  All other systems reviewed and are negative.   VITAL SIGNS:  Temp:  [96.9 F (36.1 C)-103 F (39.4 C)] 99.6 F (37.6 C) (04/25 0450) Pulse Rate:  [75-120] 102 (04/25 0450) Resp:  [14-20] 20 (04/25 0450) BP: (99-169)/(57-97) 111/65 (04/25 0450) SpO2:  [91 %-99 %] 93 % (04/25 0450) Weight:  [167.6 kg] 167.6 kg (04/24 2157)     Height: 6\' 7"  (200.7 cm) Weight: (!) 167.6 kg BMI (Calculated): 41.6   INTAKE/OUTPUT:  04/24 0701 - 04/25 0700 In: 435.1 [I.V.:435.1] Out: 400 [Urine:400]  PHYSICAL EXAM:  Physical Exam Vitals and nursing note reviewed. Exam conducted with a chaperone present.  Constitutional:      General: He is not in acute  distress.    Appearance: He is obese. He is not ill-appearing.     Comments: Patient resting in bed; NAD  HENT:     Head: Normocephalic and atraumatic.  Eyes:     General: No scleral icterus.    Extraocular Movements: Extraocular movements intact.  Cardiovascular:     Rate and Rhythm: Regular rhythm. Tachycardia present.     Comments: Borderline tachycardia at 102 bpm Pulmonary:     Effort: Pulmonary effort is normal. No respiratory distress.     Breath sounds: Normal breath sounds.  Abdominal:     General: Abdomen is protuberant.     Palpations: Abdomen is soft.     Tenderness: There is abdominal tenderness in the right upper quadrant, epigastric  area and periumbilical area. There is no guarding or rebound.     Comments: Abdomen is obese, soft, he has focal tenderness to the RUQ and epigastrium, difficult to ascertain distension given body habitus, he does not appear to be guarded.   Genitourinary:    Comments: Deferred Neurological:     General: No focal deficit present.     Mental Status: He is alert and oriented to person, place, and time.  Psychiatric:        Mood and Affect: Mood normal.        Behavior: Behavior normal.      Labs:     Latest Ref Rng & Units 06/09/2022    5:45 AM 06/08/2022    7:30 PM 06/08/2022    4:40 PM  CBC  WBC 4.0 - 10.5 K/uL 16.8  9.4  10.4   Hemoglobin 13.0 - 17.0 g/dL 16.1  09.6  04.5   Hematocrit 39.0 - 52.0 % 40.4  40.1  45.2   Platelets 150 - 400 K/uL 156  152  187       Latest Ref Rng & Units 06/09/2022    5:45 AM 06/08/2022    4:40 PM 04/18/2021    9:38 AM  CMP  Glucose 70 - 99 mg/dL 409  811  914   BUN 6 - 20 mg/dL Creatinine 0.61 - 1.24 mg/dL 7.82  9.56  2.13   Sodium 135 - 145 mmol/L 137  134  137   Potassium 3.5 - 5.1 mmol/L 3.6  3.7  4.0   Chloride 98 - 111 mmol/L 106  101  102   CO2 22 - 32 mmol/L Calcium 8.9 - 10.3 mg/dL 8.5  9.2  8.9   Total Protein 6.5 - 8.1 g/dL 6.4  7.6  7.0   Total Bilirubin  0.3 - 1.2 mg/dL 2.3  1.3  1.0   Alkaline Phos 38 - 126 U/L 36  52  53   AST 15 - 41 U/L ALT 0 - 44 U/L 34  42  41     Imaging studies:   CT Abdomen/Pelvis (06/08/2022) personally reviewed with inflammation of the ascending colon/hepatic flexure, there is contained extra-luminal air, no abscess, and radiologist report reviewed below:  IMPRESSION: Wall thickening, inflammation and extraluminal gas associated with the ascending colon near the hepatic flexure. This may be in area of biopsy on recent colonoscopy, compatible with perforation. No free intraperitoneal air elsewhere within the abdomen or pelvis.   Assessment/Plan: (ICD-10's: K63.1) 57 y.o. male with iatrogenic colon perforation secondary to colonoscopy (04/24)   - Appreciate medicine admission  - For now, will manage conservatively for now. I do think he is still "in a grey area" as he is having tenderness although reportedly improved as well as fever overnight with worsening leukocytosis. He understands that should he fail to improve, or clinically deteriorate, we would need to proceed more emergently with intervention and likely laparotomy  - NPO + IVF support  - IV Abx (Zosyn)  - Monitor abdominal examination  - May need repeat imaging in 48-72 hours pending clinical condition/progress   - Pain control prn; antiemetics prn   - Mobilize as tolerated   - Monitor leukocytosis  - Monitor fever curve   - Further management per primary service; we will follow   All of the above findings and recommendations were discussed with the  patient, and all of patient's questions were answered to his expressed satisfaction.  Thank you for the opportunity to participate in this patient's care.   -- Lynden Oxford, PA-C [facility] 06/09/2022, 8:02 AM M-F: 7am - 4pm

## 2022-06-09 NOTE — Progress Notes (Signed)
  Progress Note   Patient: Trevor Castillo ZOX:[id number] DOB: February 08, 1966 DOA: 06/08/2022     1 DOS: the patient was seen and examined on 06/09/2022    Subjective:  Patient seen and examined at bedside this morning Still has some abdominal pain on the right side however better today than yesterday Still denied nausea vomiting chest pain or cough  Brief hospital course: Trevor Castillo is an 57 y.o. male seen in ed today for adb pain. Pt had EGD/colonoscopy 06/08/2022 and after going home started having abdominal pain with abdominal distention and therefore came to the emergency room for further management.  CT scan showed wall thickening inflammation and extraluminal gas in the ascending colon near the hepatic flexure.  General surgeon following   Assessment and Plan:  Abdominal pain Severe sepsis secondary to peritonitis-present on admission  Patient met SIRS criteria on admissionwith Tmax of 103, pulse 120 and WBC 16.8.  CT scan of the abdomen showed findings of localized peritonitis in the setting of acute bowel perforation. Patient also had findings suggestive of end organ damage including hepatic dysfunction with bilirubin level of 2.3 and lactic acid 2.2. In seen on CT scan with wall thickening inflammation and extraluminal gas in the ascending colon near the hepatic flexure.  General surgery Dr. Claudine Mouton consulted by ED provider. Continue IV fluid resuscitation Continue antibiotics We will keep n.p.o. for now until after surgical clearance Continue to trend lactic acid level  Obstructive sleep apnea (adult)   CPAP per home settings.     Essential hypertension Patient takes amlodipine, hydralazine, losartan and metoprolol  DVT prophylaxis-continue heparin     GERD  Physical Exam: Constitutional:  General: He is not in acute distress. HENT:  Head: Normocephalic and atraumatic.  Cardiovascular: No murmur Pulmonary:     Effort: Pulmonary effort is normal.  Abdominal:      General: Bowel sounds are absent. There is distension.     Palpations: There is no mass.     Tenderness: There is generalized abdominal tenderness. There is guarding.  Musculoskeletal:     Right lower leg: No edema.     Left lower leg: No edema.  Skin:    General: Skin is warm.  Neurological:     General: No focal deficit present.     Mental Status: He is alert and oriented to person, place, and time.  Psychiatric:        Attention and Perception: Attention normal.       Data Reviewed: Have personally reviewed patient's laboratory results today showing elevated    Family Communication: None present at bedside  Disposition: Status is: Inpatient Still continues to meet inpatient criteria given possible need for surgical intervention WBC 16.8, lactic acid 2.2,  bilirubin 2.3  Time spent: 45 minutes   Vitals:   06/09/22 0035 06/09/22 0132 06/09/22 0450 06/09/22 0804  BP: 99/64 104/66 111/65 124/66  Pulse: (!) 120 (!) 114 (!) 102 94  Resp: Temp: (!) 102.2 F (39 C) (!) 100.8 F (38.2 C) 99.6 F (37.6 C) 99.7 F (37.6 C)  TempSrc:  Oral Oral   SpO2: 92% 91% 93%   Weight:      Height:         Author: Loyce Dys, MD 06/09/2022 8:13 AM  For on call review [email or web address].

## 2022-06-09 NOTE — Evaluation (Signed)
Occupational Therapy Evaluation Patient Details Name: Trevor Castillo MRN: [id number] DOB: 1965/12/22 Today's Date: 06/09/2022   History of Present Illness Patient is a 57 y/o male who presented yesterday afternoon to the ED  d/t severe abdominal pain he experienced several hours after colonoscopy with polypectomy that was performed yesterday AM (06/08/22).  Patient was found to have moderate leukocytosis, inability to pass flatus or BM, and a CT that showed wall thickening in the area of recent polypectomy along with some small amount of extraluminal gas without free intraperitoneal air noted by the radiologist.  Clinical appearance appeared to be of microperforation of the bowel related to polypectomy   Clinical Impression   Pt supine in bed and agreeable to OT intervention. Pt reports being Ind at baseline and working full time. Pt does endorse 7/10 pain in abdomen. He lives with wife that can assist at home if needed. Pt performed bed mobility without assistance and stands to ambulate in room independently. Pt stands at sink and brushes teeth. He reports recent BM in bathroom and he has been taking himself today. Pt has no concerns about discharge and returning home. Pt does not need skilled OT intervention at this time. OT to complete order.      Recommendations for follow up therapy are one component of a multi-disciplinary discharge planning process, led by the attending physician.  Recommendations may be updated based on patient status, additional functional criteria and insurance authorization.   Assistance Recommended at Discharge PRN     Functional Status Assessment  Patient has not had a recent decline in their functional status  Equipment Recommendations  None recommended by OT       Precautions / Restrictions Precautions Precautions: None Restrictions Weight Bearing Restrictions: No      Mobility Bed Mobility Overal bed mobility: Independent                   Transfers Overall transfer level: Independent Equipment used: None                      Balance Overall balance assessment: Independent                                         ADL either performed or assessed with clinical judgement   ADL Overall ADL's : Independent                                       General ADL Comments: Pt managing IV pole for mobility in room and self care tasks at sink without assistance. Pt has been taking himself to bathroom.     Vision Patient Visual Report: No change from baseline              Pertinent Vitals/Pain Pain Assessment Pain Assessment: 0-10 Pain Score: 7  Pain Location: R UQ Pain Descriptors / Indicators: Discomfort, Grimacing, Guarding Pain Intervention(s): Monitored during session, Premedicated before session, Repositioned     Hand Dominance Right   Extremity/Trunk Assessment Upper Extremity Assessment Upper Extremity Assessment: Overall WFL for tasks assessed   Lower Extremity Assessment Lower Extremity Assessment: Overall WFL for tasks assessed   Cervical / Trunk Assessment Cervical / Trunk Assessment: Normal   Communication Communication Communication: No difficulties   Cognition Arousal/Alertness: Awake/alert Behavior During Therapy:  WFL for tasks assessed/performed Overall Cognitive Status: Within Functional Limits for tasks assessed                                                  Home Living Family/patient expects to be discharged to:: Private residence Living Arrangements: Spouse/significant other Available Help at Discharge: Family                         Home Equipment: None          Prior Functioning/Environment Prior Level of Function : Independent/Modified Independent;Driving;Working/employed             Mobility Comments: independent ADLs Comments: independent and works full time at [employer] can be found in the care plan section) Acute Rehab OT Goals Patient Stated Goal: to go home OT Goal Formulation: With patient Time For Goal Achievement: 06/09/22 Potential to Achieve Goals: Good  OT Frequency:         AM-PAC OT "6 Clicks" Daily Activity     Outcome Measure Help from another person eating meals?: None Help from another person taking care of personal grooming?: None Help from another person toileting, which includes using toliet, bedpan, or urinal?: None Help from another person bathing (including washing, rinsing, drying)?: None Help from another person to put on and taking off regular upper body clothing?: None Help from another person to put on and taking off regular lower body clothing?: None 6 Click Score: 24   End of Session Nurse Communication: Mobility status  Activity Tolerance: Patient tolerated treatment well Patient left: in bed;with call bell/phone within reach                   Time: 1451-1502 OT Time Calculation (min): 11 min Charges:  OT General Charges $OT Visit: 1 Visit OT Evaluation $OT Eval Low Complexity: 1 Low  Jackquline Denmark, MS, OTR/L , CBIS ascom [phone]  06/09/22, 3:20 PM

## 2022-06-09 NOTE — Progress Notes (Signed)
[name] Courtesy Note  Patient is a 57 y/o male who presented yesterday afternoon to the ED by my request d/t severe abdominal pain he experienced several hours after colonoscopy with polypectomy that I performed yesterday AM (06/08/22). Patient was found to have moderate leukocytosis, inability to pass flatus or BM, and a CT that showed wall thickening in the area of recent polypectomy along with some small amount of extraluminal gas without free intraperitoneal air noted by the radiologist. Clinical appearance appeared to be of microperforation of the bowel related to polypectomy. Clips had been placed at the post-polypectomy site at the time of colonoscopy and this was also evident on the CT findings. Patient was admitted to the hospitalist service and Surgery and GI consults were obtained by Gala Romney, PA-C from ED. Currently, the patient is in some mild discomfort but is receiving some pain medication with moderate relief. He is NPO currently and is receiving lactated ringer's IV hydration and Zosyn antibiotic as conservative management. Currnelty, [name] has pain on his right side but has been able to pass moderate flatus 4-5 times since hospital admission.  Vitals:   06/09/22 0450 06/09/22 0804  BP: 111/65 124/66  Pulse: (!) 102 94  Resp: 20 16  Temp: 99.6 F (37.6 C) 99.7 F (37.6 C)  SpO2: 93%      HEENT: [place]/AT. PERRLA. EOMI. External ear exam normal. Neck: Supple without adenopathy. Trachea appears midline. Chest: Clear to auscultation. No wheezes or rales. Cardiovascular: Regular rate, normal S1 and S2 heart sounds. No gallop. Abdomen: Soft, distended, tender on Right middle quadrant. No evidence of rebound, guarding, hepatosplenomegaly or rigidity. Bowel sounds hypoactive but present. Extremities: No atrophy. Negative for edema. Pulses 2+ bilaterally.  Neuro: Alert and oriented x 3. Nonfocal. Skin: No obvious rashes. No pallor. Psychiatric:  Mood is euthymic. Appropriately attentive and conversive. Oriented x 3.    Labs:    Latest Ref Rng & Units 06/09/2022    5:45 AM 06/08/2022    7:30 PM 06/08/2022    4:40 PM  CBC  WBC 4.0 - 10.5 K/uL 16.8  9.4  10.4   Hemoglobin 13.0 - 17.0 g/dL 65.7  84.6  96.2   Hematocrit 39.0 - 52.0 % 40.4  40.1  45.2   Platelets 150 - 400 K/uL 156  152  187     CMP     Component Value Date/Time   NA 137 06/09/2022 0545   K 3.6 06/09/2022 0545   CL 106 06/09/2022 0545   CO2 22 06/09/2022 0545   GLUCOSE 128 (H) 06/09/2022 0545   BUN 14 06/09/2022 0545   CREATININE 1.02 06/09/2022 0545   CALCIUM 8.5 (L) 06/09/2022 0545   PROT 6.4 (L) 06/09/2022 0545   ALBUMIN 3.3 (L) 06/09/2022 0545   AST 25 06/09/2022 0545   ALT 34 06/09/2022 0545   ALKPHOS 36 (L) 06/09/2022 0545   BILITOT 2.3 (H) 06/09/2022 0545   GFRNONAA >60 06/09/2022 0545      Impression:  Iatrogenic microperforation of ascending colon - Hemodynamically stable, no free intraperitoneal air 2.   Abdominal pain, as above. 3.   Colonic diverticulosis. 4.   Essential hypertension. 5.   Obstructive Sleep Apnea.   Plan:    Continue IV fluids, antibiotics, bowel rest for at least 72 hours. 2.     Continue pain management. 3.     Repeat CT in 2-3 days to monitor progress.  Will follow along with you Thanks to hospitalist and  surgery services for help.  Thank you  T. Bing Plume, M.D. ABIM Diplomate in Gastroenterology [facility] [phone] - Cell

## 2022-06-10 DIAGNOSIS — K631 Perforation of intestine (nontraumatic): Secondary | ICD-10-CM | POA: Diagnosis not present

## 2022-06-10 LAB — BASIC METABOLIC PANEL
Anion gap: 8 (ref 5–15)
BUN: 19 mg/dL (ref 6–20)
CO2: 23 mmol/L (ref 22–32)
Calcium: 8.4 mg/dL — ABNORMAL LOW (ref 8.9–10.3)
Chloride: 104 mmol/L (ref 98–111)
Creatinine, Ser: 0.93 mg/dL (ref 0.61–1.24)
GFR, Estimated: >60 mL/min (ref >60)
Glucose, Bld: 122 mg/dL — ABNORMAL HIGH (ref 70–99)
Potassium: 3.2 mmol/L — ABNORMAL LOW (ref 3.5–5.1)
Sodium: 135 mmol/L (ref 135–145)

## 2022-06-10 LAB — URINE CULTURE: Culture: <10000 — AB

## 2022-06-10 LAB — CBC
HCT: 38.2 % — ABNORMAL LOW (ref 39.0–52.0)
Hemoglobin: 13.4 g/dL (ref 13.0–17.0)
MCH: 35.6 pg — ABNORMAL HIGH (ref 26.0–34.0)
MCHC: 35.1 g/dL (ref 30.0–36.0)
MCV: 101.6 fL — ABNORMAL HIGH (ref 80.0–100.0)
Platelets: 139 10*3/uL — ABNORMAL LOW (ref 150–400)
RBC: 3.76 MIL/uL — ABNORMAL LOW (ref 4.22–5.81)
RDW: 12.5 % (ref 11.5–15.5)
WBC: 13.2 10*3/uL — ABNORMAL HIGH (ref 4.0–10.5)
nRBC: 0 % (ref 0.0–0.2)

## 2022-06-10 LAB — CULTURE, BLOOD (ROUTINE X 2): Special Requests: ADEQUATE

## 2022-06-10 MED ORDER — LACTATED RINGERS IV SOLN: INTRAVENOUS | Status: AC

## 2022-06-10 MED ORDER — LORAZEPAM 0.5 MG PO TABS
0.5000 mg | ORAL_TABLET | Freq: Two times a day (BID) | ORAL | Status: DC | PRN
  Administered 2022-06-10 – 2022-06-12 (×5): 0.5 mg via ORAL
  Filled 2022-06-10 (×5): qty 1

## 2022-06-10 MED ORDER — POTASSIUM CHLORIDE CRYS ER 20 MEQ PO TBCR
40.0000 meq | EXTENDED_RELEASE_TABLET | ORAL | Status: AC
  Administered 2022-06-10 (×2): 40 meq via ORAL
  Filled 2022-06-10 (×2): qty 2

## 2022-06-10 NOTE — Progress Notes (Signed)
Physical Therapy Treatment Patient Details Name: Trevor Castillo MRN: [id number] DOB: 11-16-1965 Today's Date: 06/10/2022   History of Present Illness Patient is a 57 y/o male who presented yesterday afternoon to the ED  d/t severe abdominal pain he experienced several hours after colonoscopy with polypectomy that was performed yesterday AM (06/08/22).  Patient was found to have moderate leukocytosis, inability to pass flatus or BM, and a CT that showed wall thickening in the area of recent polypectomy along with some small amount of extraluminal gas without free intraperitoneal air noted by the radiologist.  Clinical appearance appeared to be of microperforation of the bowel related to polypectomy    PT Comments    Patient received in bed, he reports he is having a little less pain. Has been getting up to the bathroom independently. Patient hesitant, however agrees to ambulate. Spouse at bedside. He is mod I with bed mobility ( use of rails). Transfers independently and ambulated >250 feet with supervision. Patient reporting 8/10 pain with mobility. Patient is independent at this time with mobility and will continue to benefit from ambulation with nursing staff or mobility specialist at this time.      Recommendations for follow up therapy are one component of a multi-disciplinary discharge planning process, led by the attending physician.  Recommendations may be updated based on patient status, additional functional criteria and insurance authorization.  Follow Up Recommendations       Assistance Recommended at Discharge PRN  Patient can return home with the following Assist for transportation;Help with stairs or ramp for entrance   Equipment Recommendations  None recommended by PT    Recommendations for Other Services       Precautions / Restrictions Precautions Precautions: None Restrictions Weight Bearing Restrictions: No     Mobility  Bed Mobility Overal bed mobility:  Modified Independent Bed Mobility: Supine to Sit, Sit to Supine     Supine to sit: Modified independent (Device/Increase time) Sit to supine: Modified independent (Device/Increase time)   General bed mobility comments: some difficulty due to pain, use of bed rails, no physical assist needed    Transfers Overall transfer level: Independent Equipment used: None               General transfer comment: stands from elevated bed as patient is 6'7"    Ambulation/Gait Ambulation/Gait assistance: Independent Gait Distance (Feet): 250 Feet Assistive device: IV Pole   Gait velocity: WFL     General Gait Details: no lob or difficulty walking. Limited by pain   Stairs             Wheelchair Mobility    Modified Rankin (Stroke Patients Only)       Balance Overall balance assessment: Independent                                          Cognition Arousal/Alertness: Awake/alert Behavior During Therapy: WFL for tasks assessed/performed Overall Cognitive Status: Within Functional Limits for tasks assessed                                          Exercises      General Comments        Pertinent Vitals/Pain Pain Assessment Pain Score: 8  Pain Location: R UQ  Home Living                          Prior Function            PT Goals (current goals can now be found in the care plan section) Acute Rehab PT Goals Patient Stated Goal: to decrease pain, go home PT Goal Formulation: With patient Time For Goal Achievement: 06/23/22 Potential to Achieve Goals: Good Progress towards PT goals: Goals met/education completed, patient discharged from PT    Frequency           PT Plan Current plan remains appropriate    Co-evaluation              AM-PAC PT "6 Clicks" Mobility   Outcome Measure  Help needed turning from your back to your side while in a flat bed without using bedrails?: A Little Help  needed moving from lying on your back to sitting on the side of a flat bed without using bedrails?: A Little Help needed moving to and from a bed to a chair (including a wheelchair)?: None Help needed standing up from a chair using your arms (e.g., wheelchair or bedside chair)?: None Help needed to walk in hospital room?: None Help needed climbing 3-5 steps with a railing? : None 6 Click Score: 22    End of Session   Activity Tolerance: Patient limited by pain Patient left: in bed;with call bell/phone within reach;with family/visitor present Nurse Communication: Mobility status PT Visit Diagnosis: Other abnormalities of gait and mobility (R26.89);Pain Pain - Right/Left: Right Pain - part of body:  (abdomen)     Time: 7829-5621 PT Time Calculation (min) (ACUTE ONLY): 11 min  Charges:  $Gait Training: 8-22 mins                     Ved Martos, PT, GCS 06/10/22,1:30 PM

## 2022-06-10 NOTE — Progress Notes (Signed)
[name] brief Courtesy Note  Patient seen for f/u small, contained iatrogenic perforation of the bowel secondary to polypectomy during colonoscopy. Sleeping comfortably.  Vitals:   06/10/22 0820 06/10/22 1553  BP:  122/67  Pulse: 94 94  Resp:  18  Temp:  98 F (36.7 C)  SpO2: 93% 92%       Labs:    Latest Ref Rng & Units 06/10/2022    8:37 AM 06/09/2022    5:45 AM 06/08/2022    7:30 PM  CBC  WBC 4.0 - 10.5 K/uL 13.2  16.8  9.4   Hemoglobin 13.0 - 17.0 g/dL 16.1  09.6  04.5   Hematocrit 39.0 - 52.0 % 38.2  40.4  40.1   Platelets 150 - 400 K/uL 139  156  152     CMP     Component Value Date/Time   NA 135 06/10/2022 0837   K 3.2 (L) 06/10/2022 0837   CL 104 06/10/2022 0837   CO2 23 06/10/2022 0837   GLUCOSE 122 (H) 06/10/2022 0837   BUN 19 06/10/2022 0837   CREATININE 0.93 06/10/2022 0837   CALCIUM 8.4 (L) 06/10/2022 0837   PROT 6.4 (L) 06/09/2022 0545   ALBUMIN 3.3 (L) 06/09/2022 0545   AST 25 06/09/2022 0545   ALT 34 06/09/2022 0545   ALKPHOS 36 (L) 06/09/2022 0545   BILITOT 2.3 (H) 06/09/2022 0545   GFRNONAA >60 06/10/2022 4098      Impression:  Iatrogenic colon perforation, contained - location ascending colon near hepatic flexure. 2.   OSA 3.   Hypertension 4.   Tubular adenoma of colon - 06/08/2022 colonoscopy   Plan:  Continue NPO 2.   Advance diet as per surgery. Appreciate help. 3.   Repeat CT prior to discharge. 4.   Continue pain control. 5.   Case discussed with patient's wife, Rashan Rounsaville.  Following along with you.   Thank you  T. Bing Plume, M.D. ABIM Diplomate in Gastroenterology [facility] [phone] - Cell

## 2022-06-10 NOTE — Progress Notes (Signed)
Pt has had a total of 4 liquid brownish-yellow stools just since 0700.

## 2022-06-10 NOTE — Progress Notes (Signed)
  Progress Note   Patient: Trevor Castillo WJX:[id number] DOB: [birth year] DOA: 06/08/2022     2 DOS: the patient was seen and examined on 06/10/2022      Subjective:  Patient seen and examined at bedside this morning Has some right-sided abdominal pain but improving Continues to remain n.p.o. Still denied nausea vomiting chest pain or cough   Brief hospital course: MAKHARI DOVIDIO is an 57 y.o. male seen in ed today for adb pain. Pt had EGD/colonoscopy 06/08/2022 and after going home started having abdominal pain with abdominal distention and therefore came to the emergency room for further management.  CT scan showed wall thickening inflammation and extraluminal gas in the ascending colon near the hepatic flexure.  General surgeon following   Assessment and Plan:   Abdominal pain Severe sepsis secondary to peritonitis-present on admission  Patient met SIRS criteria on admissionwith Tmax of 103, pulse 120 and WBC 16.8.  CT scan of the abdomen showed findings of localized peritonitis in the setting of acute bowel perforation. Patient also had findings suggestive of end organ damage including hepatic dysfunction with bilirubin level of 2.3 and lactic acid 2.2. In seen on CT scan with wall thickening inflammation and extraluminal gas in the ascending colon near the hepatic flexure.  General surgery Dr. Claudine Mouton consulted by ED provider. Continue IV fluid resuscitation Continue antibiotics We will keep n.p.o. for now until after surgical clearance Continue to trend lactic acid level   Obstructive sleep apnea (adult)   CPAP per home settings.     Essential hypertension Patient takes amlodipine, hydralazine, losartan and metoprolol   DVT prophylaxis-continue heparin     GERD   Physical Exam: Constitutional:  General: He is not in acute distress. HENT:  Head: Normocephalic and atraumatic.  Cardiovascular: No murmur Pulmonary:     Effort: Pulmonary effort is normal.  Abdominal:      General: Bowel sounds are absent. There is distension.     Palpations: There is no mass.     Tenderness: There is generalized abdominal tenderness. There is guarding.  Musculoskeletal:     Right lower leg: No edema.     Left lower leg: No edema.  Skin:    General: Skin is warm.  Neurological:     General: No focal deficit present.     Mental Status: He is alert and oriented to person, place, and time.  Psychiatric:        Attention and Perception: Attention normal.         Data Reviewed: Have personally reviewed patient's laboratory results today showing elevated      Family Communication: None present at bedside   Disposition: Status is: Inpatient Still continues to meet inpatient criteria given possible need for surgical intervention WBC 16.8, lactic acid 2.2,  bilirubin 2.3   Time spent: 40 minutes     Vitals:   06/10/22 0000 06/10/22 0734 06/10/22 0820 06/10/22 1553  BP:  126/73  122/67  Pulse:  98 94 94  Resp:  20  18  Temp: 99.3 F (37.4 C) 98.8 F (37.1 C)  98 F (36.7 C)  TempSrc: Oral Oral  Oral  SpO2:  90% 93% 92%  Weight:      Height:         Author: Loyce Dys, MD 06/10/2022 3:54 PM  For on call review [email or web address].

## 2022-06-10 NOTE — Progress Notes (Signed)
[facility] SURGICAL PROGRESS NOTE (cpt [phone])  Hospital Day(s): 2.   Interval History: Patient seen and examined, no acute events or new complaints overnight. Patient reports he is feeling slightly improved. Still with abdominal pain; now 3/10. Again, endorses this has improved. No nausea/emesis. No further fevers. Hemodynamically stable. Labs ordered this AM; pending. He is passing flatus and had numerous BM.   Review of Systems:  Constitutional: denies fever, chills  HEENT: denies cough or congestion  Respiratory: denies any shortness of breath  Cardiovascular: denies chest pain or palpitations  Gastrointestinal: + abdominal pain (right sided; improving); denied N/V Genitourinary: denies burning with urination or urinary frequency Musculoskeletal: denies pain, decreased motor or sensation  Vital signs in last 24 hours: [min-max] current  Temp:  [98.8 F (37.1 C)-100.3 F (37.9 C)] 98.8 F (37.1 C) (04/26 0734) Pulse Rate:  [84-98] 98 (04/26 0734) Resp:  [16-20] 20 (04/26 0734) BP: (120-126)/(66-73) 126/73 (04/26 0734) SpO2:  [90 %-94 %] 90 % (04/26 0734)     Height: 6\' 7"  (200.7 cm) Weight: (!) 167.6 kg BMI (Calculated): 41.6   Intake/Output last 2 shifts:  04/25 0701 - 04/26 0700 In: 1259.8 [I.V.:559.8; IV Piggyback:100] Out: 550 [Urine:550]   Physical Exam:  Constitutional: alert, cooperative and no distress  HENT: normocephalic without obvious abnormality  Eyes: PERRL, EOM's grossly intact and symmetric  Respiratory: breathing non-labored at rest  Cardiovascular: regular rate and sinus rhythm  Gastrointestinal: Obese, soft, still point tender in right abdomen but this is markedly improved, difficult to ascertain distension given body habitus. He is certainly without overt peritonitis  Musculoskeletal: no edema or wounds, motor and sensation grossly intact, NT    Labs:     Latest Ref Rng & Units 06/09/2022    5:45 AM 06/08/2022    7:30 PM 06/08/2022     4:40 PM  CBC  WBC 4.0 - 10.5 K/uL 16.8  9.4  10.4   Hemoglobin 13.0 - 17.0 g/dL 60.4  54.0  98.1   Hematocrit 39.0 - 52.0 % 40.4  40.1  45.2   Platelets 150 - 400 K/uL 156  152  187       Latest Ref Rng & Units 06/09/2022    5:45 AM 06/08/2022    4:40 PM 04/18/2021    9:38 AM  CMP  Glucose 70 - 99 mg/dL 191  478  295   BUN 6 - 20 mg/dL 14  11  15    Creatinine 0.61 - 1.24 mg/dL 6.21  3.08  6.57   Sodium 135 - 145 mmol/L 137  134  137   Potassium 3.5 - 5.1 mmol/L 3.6  3.7  4.0   Chloride 98 - 111 mmol/L 106  101  102   CO2 22 - 32 mmol/L 22  23  24    Calcium 8.9 - 10.3 mg/dL 8.5  9.2  8.9   Total Protein 6.5 - 8.1 g/dL 6.4  7.6  7.0   Total Bilirubin 0.3 - 1.2 mg/dL 2.3  1.3  1.0   Alkaline Phos 38 - 126 U/L 36  52  53   AST 15 - 41 U/L 25  31  23    ALT 0 - 44 U/L 34  42  41      Imaging studies: No new pertinent imaging studies   Assessment/Plan: (ICD-10's: K63.1) 57 y.o. male with clinically improving iatrogenic colon perforation secondary to colonoscopy (04/24)   - Fortunately, he has continued to make good progress with improvement in  his abdominal pain, maintaining hemodynamics, and has now been afebrile x24 hours. I do NOT think he requires any emergent surgical intervention at this time. He understands that should he fail to improve, or clinically deteriorate, we would need to proceed more emergently with intervention and likely laparotomy. Pathology from colonoscopy is without malignancy  - Continue NPO today; If continues to improve, I think it is reasonable to start CLD tomorrow (04/27)  - Continue IVF support; LR at 50 ml/hr             - IV Abx (Zosyn)             - Monitor abdominal examination             - May need repeat imaging in 24-48 hours pending clinical condition/progress              - Pain control prn; antiemetics prn              - Mobilize as tolerated              - Monitor leukocytosis; pending             - Monitor fever curve; afebrile x24 hours              - Further management per primary service; we will follow    All of the above findings and recommendations were discussed with the patient, and all of patient's questions were answered to his expressed satisfaction.  -- Lynden Oxford, PA-C [facility] 06/10/2022, 7:44 AM M-F: 7am - 4pm

## 2022-06-11 DIAGNOSIS — K631 Perforation of intestine (nontraumatic): Secondary | ICD-10-CM | POA: Diagnosis not present

## 2022-06-11 MED ORDER — TRAMADOL HCL 50 MG PO TABS
50.0000 mg | ORAL_TABLET | Freq: Two times a day (BID) | ORAL | Status: DC | PRN
  Administered 2022-06-11 – 2022-06-13 (×4): 50 mg via ORAL
  Filled 2022-06-11 (×4): qty 1

## 2022-06-11 MED ORDER — LACTATED RINGERS IV SOLN: INTRAVENOUS | Status: DC

## 2022-06-11 NOTE — Progress Notes (Signed)
Patient ID: Trevor Castillo, male   DOB: 1965/03/13, 57 y.o.   MRN: [id number]     SURGICAL PROGRESS NOTE   Hospital Day(s): 3.   Interval History: Patient seen and examined, no acute events or new complaints overnight. Patient reports feeling much better this morning.  He denies any abdominal pain.  He has been able to ambulate without pain.  He denies any nausea or vomiting.  He endorses passing gas and having liquid bowel movement.  Vital signs in last 24 hours: [min-max] current  Temp:  [98 F (36.7 C)-100.2 F (37.9 C)] 99.6 F (37.6 C) (04/27 0747) Pulse Rate:  [91-94] 91 (04/27 0747) Resp:  [16-18] 18 (04/27 0747) BP: (122-136)/(62-77) 136/74 (04/27 0747) SpO2:  [92 %-95 %] 92 % (04/27 0747)     Height: 6\' 7"  (200.7 cm) Weight: (!) 167.6 kg BMI (Calculated): 41.6   Physical Exam:  Constitutional: alert, cooperative and no distress  Respiratory: breathing non-labored at rest  Cardiovascular: regular rate and sinus rhythm  Gastrointestinal: soft, non-tender, and non-distended with specifically there is no tenderness on palpation on the right lower quadrant.  Labs:     Latest Ref Rng & Units 06/10/2022    8:37 AM 06/09/2022    5:45 AM 06/08/2022    7:30 PM  CBC  WBC 4.0 - 10.5 K/uL 13.2  16.8  9.4   Hemoglobin 13.0 - 17.0 g/dL 95.6  21.3  08.6   Hematocrit 39.0 - 52.0 % 38.2  40.4  40.1   Platelets 150 - 400 K/uL 139  156  152       Latest Ref Rng & Units 06/10/2022    8:37 AM 06/09/2022    5:45 AM 06/08/2022    4:40 PM  CMP  Glucose 70 - 99 mg/dL 578  469  629   BUN 6 - 20 mg/dL 19  14  11    Creatinine 0.61 - 1.24 mg/dL 5.28  4.13  2.44   Sodium 135 - 145 mmol/L 135  137  134   Potassium 3.5 - 5.1 mmol/L 3.2  3.6  3.7   Chloride 98 - 111 mmol/L 104  106  101   CO2 22 - 32 mmol/L 23  22  23    Calcium 8.9 - 10.3 mg/dL 8.4  8.5  9.2   Total Protein 6.5 - 8.1 g/dL  6.4  7.6   Total Bilirubin 0.3 - 1.2 mg/dL  2.3  1.3   Alkaline Phos 38 - 126 U/L  36  52   AST 15 - 41 U/L   25  31   ALT 0 - 44 U/L  34  42     Imaging studies: I personally evaluated the CT scan after colonoscopy.  Very localized small amount of air next to the ascending colon at the site of the polypectomy.  No free air.   Assessment/Plan:  57 y.o. male with microperforation of the colon after polypectomy endoscopically.   -There is adequate bowel sounds.  No fever.  Tachycardia. -Abdominal physical exam without tenderness to palpation in the right side of the abdomen. -Clinically no deterioration with current conservative management -Will start clear liquid diet and assess for toleration -Continue IV antibiotic therapy -Will continue to follow closely   Gae Gallop, MD

## 2022-06-11 NOTE — Progress Notes (Addendum)
[name] brief Courtesy Note  Patient seen for f/u iatrogenic contained perforation of ascending colon. Doing much better today. Had 4-5 soft brownish-yellow stools yesterday. No pain except with sudden postural changes.  Vitals:   06/11/22 0431 06/11/22 0747  BP: 129/62 136/74  Pulse: 93 91  Resp: 16 18  Temp: 98.5 F (36.9 C) 99.6 F (37.6 C)  SpO2: 94% 92%    HEENT: [place]/AT. PERRLA. EOMI. External ear exam normal. Neck: Supple without adenopathy. Trachea appears midline. Chest: Clear to auscultation. No wheezes or rales. Cardiovascular: Regular rate, normal S1 and S2 heart sounds. No gallop. Abdomen: Soft, minimally tender on right lower quadrant without masses, hepatosplenomegaly or rigidity. Bowel sounds present. Extremities: No atrophy. Negative for edema. Pulses 2+ bilaterally.  Neuro: Alert and oriented x 3. Nonfocal. Skin: No obvious rashes. No pallor. Psychiatric: Mood is euthymic. Appropriately attentive and conversive. Oriented x 3.     Labs:    Latest Ref Rng & Units 06/10/2022    8:37 AM 06/09/2022    5:45 AM 06/08/2022    7:30 PM  CBC  WBC 4.0 - 10.5 K/uL 13.2  16.8  9.4   Hemoglobin 13.0 - 17.0 g/dL 16.1  09.6  04.5   Hematocrit 39.0 - 52.0 % 38.2  40.4  40.1   Platelets 150 - 400 K/uL 139  156  152     CMP     Component Value Date/Time   NA 135 06/10/2022 0837   K 3.2 (L) 06/10/2022 0837   CL 104 06/10/2022 0837   CO2 23 06/10/2022 0837   GLUCOSE 122 (H) 06/10/2022 0837   BUN 19 06/10/2022 0837   CREATININE 0.93 06/10/2022 0837   CALCIUM 8.4 (L) 06/10/2022 0837   PROT 6.4 (L) 06/09/2022 0545   ALBUMIN 3.3 (L) 06/09/2022 0545   AST 25 06/09/2022 0545   ALT 34 06/09/2022 0545   ALKPHOS 36 (L) 06/09/2022 0545   BILITOT 2.3 (H) 06/09/2022 0545   GFRNONAA >60 06/10/2022 4098      Impression:    Iatrogenic colon perforation, contained - location ascending colon near hepatic flexure. 2.   OSA 3.   Hypertension 4.   Tubular  adenoma of colon - 06/08/2022 colonoscopy     Plan:   Continue NPO 2.   Advance diet as per surgery. Patient reports clear liquid diet ordered by surgeon. 3.   Repeat CT prior to discharge. 4.   Continue pain control.   Thank you  T. Bing Plume, M.D. ABIM Diplomate in Gastroenterology [facility] [phone] - Cell

## 2022-06-11 NOTE — Progress Notes (Signed)
  Progress Note   Patient: Trevor Castillo KVQ:[id number] DOB: [birth year] DOA: 06/08/2022     3 DOS: the patient was seen and examined on 06/11/2022      Subjective:  Patient seen and examined at bedside this morning He admits to improvement in his abdominal pain Surgeon advancing diet to clear liquids today Still denied nausea vomiting chest pain or cough   Brief hospital course: Trevor Castillo is an 57 y.o. male seen in ed today for adb pain. Pt had EGD/colonoscopy 06/08/2022 and after going home started having abdominal pain with abdominal distention and therefore came to the emergency room for further management.  CT scan showed wall thickening inflammation and extraluminal gas in the ascending colon near the hepatic flexure.  General surgeon following   Assessment and Plan:   Abdominal pain Severe sepsis secondary to peritonitis-present on admission  Patient met SIRS criteria on admissionwith Tmax of 103, pulse 120 and WBC 16.8.  CT scan of the abdomen showed findings of localized peritonitis in the setting of acute bowel perforation. Patient also had findings suggestive of end organ damage including hepatic dysfunction with bilirubin level of 2.3 and lactic acid 2.2. In seen on CT scan with wall thickening inflammation and extraluminal gas in the ascending colon near the hepatic flexure.  General surgery on board we appreciate input Continue IV fluid resuscitation Continue antibiotics Advancement of diet according to surgical input    Obstructive sleep apnea (adult)   CPAP per home settings.     Essential hypertension Patient takes amlodipine, hydralazine, losartan and metoprolol   DVT prophylaxis-continue heparin     GERD   Physical Exam: Constitutional:  General: He is not in acute distress. HENT:  Head: Normocephalic and atraumatic.  Cardiovascular: No murmur Pulmonary:     Effort: Pulmonary effort is normal.  Abdominal:     General: Bowel sounds are absent.  There is distension.     Palpations: There is no mass.     Tenderness: There is generalized abdominal tenderness.  Abdominal guarding is better today  musculoskeletal:     Right lower leg: No edema.     Left lower leg: No edema.  Skin:    General: Skin is warm.  Neurological:     General: No focal deficit present.     Mental Status: He is alert and oriented to person, place, and time.  Psychiatric:        Attention and Perception: Attention normal.         Data Reviewed: Have personally reviewed patient's laboratory results today showing sodium 135 potassium 3.2 WBC improved to 13.2     Family Communication: None present at bedside   Disposition: Status is: Inpatient Still continues to meet inpatient criteria given possible need for surgical intervention    Time spent: 45 minutes         Vitals:   06/10/22 1553 06/10/22 1920 06/11/22 0431 06/11/22 0747  BP: 122/67 134/77 129/62 136/74  Pulse: 94 93 93 91  Resp: 18 18 16 18   Temp: 98 F (36.7 C) 100.2 F (37.9 C) 98.5 F (36.9 C) 99.6 F (37.6 C)  TempSrc: Oral Oral Oral Oral  SpO2: 92% 95% 94% 92%  Weight:      Height:        Author: Loyce Dys, MD 06/11/2022 3:23 PM  For on call review [email or web address].

## 2022-06-12 ENCOUNTER — Inpatient Hospital Stay: Payer: BC Managed Care – PPO

## 2022-06-12 DIAGNOSIS — K631 Perforation of intestine (nontraumatic): Secondary | ICD-10-CM | POA: Diagnosis not present

## 2022-06-12 LAB — BASIC METABOLIC PANEL
Anion gap: 9 (ref 5–15)
BUN: 17 mg/dL (ref 6–20)
CO2: 22 mmol/L (ref 22–32)
Calcium: 8.3 mg/dL — ABNORMAL LOW (ref 8.9–10.3)
Chloride: 102 mmol/L (ref 98–111)
Creatinine, Ser: 0.8 mg/dL (ref 0.61–1.24)
GFR, Estimated: >60 mL/min (ref >60)
Glucose, Bld: 107 mg/dL — ABNORMAL HIGH (ref 70–99)
Potassium: 3 mmol/L — ABNORMAL LOW (ref 3.5–5.1)
Sodium: 133 mmol/L — ABNORMAL LOW (ref 135–145)

## 2022-06-12 LAB — CBC WITH DIFFERENTIAL/PLATELET
Abs Immature Granulocytes: 0.05 10*3/uL (ref 0.00–0.07)
Basophils Absolute: 0.1 10*3/uL (ref 0.0–0.1)
Basophils Relative: 1 %
Eosinophils Absolute: 0.1 10*3/uL (ref 0.0–0.5)
Eosinophils Relative: 1 %
HCT: 37.5 % — ABNORMAL LOW (ref 39.0–52.0)
Hemoglobin: 12.9 g/dL — ABNORMAL LOW (ref 13.0–17.0)
Immature Granulocytes: 1 %
Lymphocytes Relative: 7 %
Lymphs Abs: 0.7 10*3/uL (ref 0.7–4.0)
MCH: 35 pg — ABNORMAL HIGH (ref 26.0–34.0)
MCHC: 34.4 g/dL (ref 30.0–36.0)
MCV: 101.6 fL — ABNORMAL HIGH (ref 80.0–100.0)
Monocytes Absolute: 1.1 10*3/uL — ABNORMAL HIGH (ref 0.1–1.0)
Monocytes Relative: 12 %
Neutro Abs: 7.3 10*3/uL (ref 1.7–7.7)
Neutrophils Relative %: 78 %
Platelets: 177 10*3/uL (ref 150–400)
RBC: 3.69 MIL/uL — ABNORMAL LOW (ref 4.22–5.81)
RDW: 12.1 % (ref 11.5–15.5)
WBC: 9.3 10*3/uL (ref 4.0–10.5)
nRBC: 0 % (ref 0.0–0.2)

## 2022-06-12 LAB — CULTURE, BLOOD (ROUTINE X 2): Culture: NO GROWTH

## 2022-06-12 MED ORDER — PIPERACILLIN-TAZOBACTAM 3.375 G IVPB
3.3750 g | Freq: Three times a day (TID) | INTRAVENOUS | Status: DC
  Administered 2022-06-12 – 2022-06-13 (×3): 3.375 g via INTRAVENOUS
  Filled 2022-06-12 (×3): qty 50

## 2022-06-12 NOTE — Progress Notes (Signed)
  Progress Note   Patient: Trevor Castillo:[id number] DOB: [birth year] DOA: 06/08/2022     4 DOS: the patient was seen and examined on 06/12/2022     Subjective:  Patient seen and examined at bedside this morning Did complain of numbness to the right side of his abdomen He tells me the abdominal pain is better than when he came in KUB did not show worsening abdominal findings Still denied nausea vomiting chest pain or cough   Brief hospital course: Trevor Castillo is an 57 y.o. male seen in ed today for adb pain. Pt had EGD/colonoscopy 06/08/2022 and after going home started having abdominal pain with abdominal distention and therefore came to the emergency room for further management.  CT scan showed wall thickening inflammation and extraluminal gas in the ascending colon near the hepatic flexure.  General surgeon following   Assessment and Plan:   Abdominal pain Severe sepsis secondary to peritonitis-present on admission  Patient met SIRS criteria on admissionwith Tmax of 103, pulse 120 and WBC 16.8.  CT scan of the abdomen showed findings of localized peritonitis in the setting of acute bowel perforation. Patient also had findings suggestive of end organ damage including hepatic dysfunction with bilirubin level of 2.3 and lactic acid 2.2. Abdominal CT scan with wall thickening inflammation and extraluminal gas in the ascending colon near the hepatic flexure.  General surgery on board we appreciate input Continue IV fluid resuscitation Continue antibiotics Advancement of diet according to surgical input I requested KUB today which did not show worsening abdominal findings   Obstructive sleep apnea (adult)   CPAP per home settings.     Essential hypertension Patient takes amlodipine, hydralazine, losartan and metoprolol   DVT prophylaxis-continue heparin     GERD-follows up with GI as an outpatient  Morbid obesity-BMI 41.6 Counseled on weight loss when medically  stable   Physical Exam: Constitutional:  General: He is not in acute distress. HENT:  Head: Normocephalic and atraumatic.  Cardiovascular: No murmur Pulmonary:     Effort: Pulmonary effort is normal.  Abdominal:     General: Bowel sounds are present abdominal distention better musculoskeletal:     Right lower leg: No edema.     Left lower leg: No edema.  Skin:    General: Skin is warm.  Neurological:     General: No focal deficit present.     Mental Status: He is alert and oriented to person, place, and time.  Psychiatric:        Attention and Perception: Attention normal.         Data Reviewed: Have personally reviewed patient's laboratory results today showing sodium 133 potassium 3.0 WBC normalized 9.3     Family Communication: None present at bedside   Disposition: Status is: Inpatient Still continues to meet inpatient criteria given possible need for surgical intervention    Time spent: 45 minutes   Vitals:   06/11/22 1759 06/11/22 2014 06/12/22 0518 06/12/22 0730  BP: 133/76 130/69 (!) 140/82 135/67  Pulse: 93 94 86 95  Resp: 16 18 18 13   Temp: 100.3 F (37.9 C) 98.9 F (37.2 C) 98.7 F (37.1 C) 99.9 F (37.7 C)  TempSrc: Oral Oral Oral   SpO2: 95% 94% 96% 93%  Weight:      Height:        Author: Loyce Dys, MD 06/12/2022 1:17 PM  For on call review [email or web address].

## 2022-06-12 NOTE — Progress Notes (Signed)
Patient ID: Trevor Castillo, male   DOB: 18-Dec-1965, 57 y.o.   MRN: [id number]     SURGICAL PROGRESS NOTE   Hospital Day(s): 4.   Interval History: Patient seen and examined, no acute events or new complaints overnight. Patient reports feeling sore on the right side of the abdomen but significantly improved compared to pain after colonoscopy.  Patient was found ambulating in the room.  The motion of walking does not provoke any pain.  Patient tolerated a clear liquid diet without any pain.  Denies any nausea or vomiting.  Continue passing liquid stool.  No blood in the stool.  Vital signs in last 24 hours: [min-max] current  Temp:  [98.7 F (37.1 C)-100.3 F (37.9 C)] 99.9 F (37.7 C) (04/28 0730) Pulse Rate:  [86-95] 95 (04/28 0730) Resp:  [13-18] 13 (04/28 0730) BP: (130-140)/(67-82) 135/67 (04/28 0730) SpO2:  [93 %-96 %] 93 % (04/28 0730)     Height: 6\' 7"  (200.7 cm) Weight: (!) 167.6 kg BMI (Calculated): 41.6   Physical Exam:  Constitutional: alert, cooperative and no distress  Respiratory: breathing non-labored at rest  Cardiovascular: regular rate and sinus rhythm  Gastrointestinal: soft, non-tender, and non-distended  Labs:     Latest Ref Rng & Units 06/12/2022    8:42 AM 06/10/2022    8:37 AM 06/09/2022    5:45 AM  CBC  WBC 4.0 - 10.5 K/uL 9.3  13.2  16.8   Hemoglobin 13.0 - 17.0 g/dL 40.9  81.1  91.4   Hematocrit 39.0 - 52.0 % 37.5  38.2  40.4   Platelets 150 - 400 K/uL 177  139  156       Latest Ref Rng & Units 06/12/2022    8:42 AM 06/10/2022    8:37 AM 06/09/2022    5:45 AM  CMP  Glucose 70 - 99 mg/dL 782  956  213   BUN 6 - 20 mg/dL 17  19  14    Creatinine 0.61 - 1.24 mg/dL 0.86  5.78  4.69   Sodium 135 - 145 mmol/L 133  135  137   Potassium 3.5 - 5.1 mmol/L 3.0  3.2  3.6   Chloride 98 - 111 mmol/L 102  104  106   CO2 22 - 32 mmol/L 22  23  22    Calcium 8.9 - 10.3 mg/dL 8.3  8.4  8.5   Total Protein 6.5 - 8.1 g/dL   6.4   Total Bilirubin 0.3 - 1.2 mg/dL   2.3    Alkaline Phos 38 - 126 U/L   36   AST 15 - 41 U/L   25   ALT 0 - 44 U/L   34     Imaging studies: Abdominal x-ray this morning without increase free air.  There is no free air under the diaphragm.  As per radiology's there may be a loculated gas next to the colon.  I personally evaluated the images.  Assessment/Plan:  57 y.o. male with microperforation of the colon after polypectomy endoscopically.   -Continue with adequate vital signs.  There has been no fever. -Physical exam without significant tenderness on the right side. -No clinical alteration with critical diet -As per discussion with the gastroenterologist and initial consult to surgeon, plan to repeat CT scan tomorrow morning -Clinically no peritonitis or any sign to indicate any surgical intervention at this moment. -Continue IV antibiotic therapy -Continue ambulating.  Gae Gallop, MD

## 2022-06-13 ENCOUNTER — Inpatient Hospital Stay: Payer: BC Managed Care – PPO

## 2022-06-13 DIAGNOSIS — K631 Perforation of intestine (nontraumatic): Secondary | ICD-10-CM | POA: Diagnosis not present

## 2022-06-13 LAB — CBC WITH DIFFERENTIAL/PLATELET
Abs Immature Granulocytes: 0.09 10*3/uL — ABNORMAL HIGH (ref 0.00–0.07)
Basophils Absolute: 0.1 10*3/uL (ref 0.0–0.1)
Basophils Relative: 1 %
Eosinophils Absolute: 0.2 10*3/uL (ref 0.0–0.5)
Eosinophils Relative: 2 %
HCT: 38.7 % — ABNORMAL LOW (ref 39.0–52.0)
Hemoglobin: 13.6 g/dL (ref 13.0–17.0)
Immature Granulocytes: 1 %
Lymphocytes Relative: 9 %
Lymphs Abs: 0.9 10*3/uL (ref 0.7–4.0)
MCH: 35.3 pg — ABNORMAL HIGH (ref 26.0–34.0)
MCHC: 35.1 g/dL (ref 30.0–36.0)
MCV: 100.5 fL — ABNORMAL HIGH (ref 80.0–100.0)
Monocytes Absolute: 1.2 10*3/uL — ABNORMAL HIGH (ref 0.1–1.0)
Monocytes Relative: 13 %
Neutro Abs: 7.1 10*3/uL (ref 1.7–7.7)
Neutrophils Relative %: 74 %
Platelets: 215 10*3/uL (ref 150–400)
RBC: 3.85 MIL/uL — ABNORMAL LOW (ref 4.22–5.81)
RDW: 11.9 % (ref 11.5–15.5)
WBC: 9.5 10*3/uL (ref 4.0–10.5)
nRBC: 0 % (ref 0.0–0.2)

## 2022-06-13 LAB — BASIC METABOLIC PANEL
Anion gap: 9 (ref 5–15)
BUN: 13 mg/dL (ref 6–20)
CO2: 26 mmol/L (ref 22–32)
Calcium: 8.6 mg/dL — ABNORMAL LOW (ref 8.9–10.3)
Chloride: 98 mmol/L (ref 98–111)
Creatinine, Ser: 0.82 mg/dL (ref 0.61–1.24)
GFR, Estimated: >60 mL/min (ref >60)
Glucose, Bld: 114 mg/dL — ABNORMAL HIGH (ref 70–99)
Potassium: 3.1 mmol/L — ABNORMAL LOW (ref 3.5–5.1)
Sodium: 133 mmol/L — ABNORMAL LOW (ref 135–145)

## 2022-06-13 LAB — CULTURE, BLOOD (ROUTINE X 2): Culture: NO GROWTH

## 2022-06-13 MED ORDER — ACETAMINOPHEN 325 MG PO TABS: 650.0000 mg | ORAL_TABLET | Freq: Four times a day (QID) | ORAL | 0 refills | Status: AC | PRN

## 2022-06-13 MED ORDER — IOHEXOL 9 MG/ML PO SOLN
1000.0000 mL | Freq: Once | ORAL | Status: AC | PRN
  Administered 2022-06-13: 1000 mL via ORAL

## 2022-06-13 MED ORDER — POTASSIUM CHLORIDE CRYS ER 20 MEQ PO TBCR
40.0000 meq | EXTENDED_RELEASE_TABLET | ORAL | Status: AC
  Administered 2022-06-13 (×2): 40 meq via ORAL
  Filled 2022-06-13 (×2): qty 2

## 2022-06-13 MED ORDER — IOHEXOL 300 MG/ML  SOLN
100.0000 mL | Freq: Once | INTRAMUSCULAR | Status: AC | PRN
  Administered 2022-06-13: 100 mL via INTRAVENOUS

## 2022-06-13 MED ORDER — AMOXICILLIN-POT CLAVULANATE 875-125 MG PO TABS
1.0000 | ORAL_TABLET | Freq: Two times a day (BID) | ORAL | Status: DC
  Administered 2022-06-13: 1 via ORAL
  Filled 2022-06-13 (×2): qty 1

## 2022-06-13 MED ORDER — AMOXICILLIN-POT CLAVULANATE 875-125 MG PO TABS: 1.0000 | ORAL_TABLET | Freq: Two times a day (BID) | ORAL | 0 refills | Status: AC

## 2022-06-13 NOTE — Progress Notes (Addendum)
Progress Note   Patient: Trevor Castillo [id number]:[id number] DOB: 1965/07/20 DOA: 06/08/2022     5 DOS: the patient was seen and examined on 06/13/2022    Subjective:  Patient seen and examined at bedside this morning Abdominal CT scan results reviewed which did not show any worsening of abdominal pathology Denies nausea vomiting Abdominal pain is almost completely resolved -Advancing diet today   Brief hospital course: Trevor Castillo is an 57 y.o. male seen in ed today for adb pain. Pt had EGD/colonoscopy 06/08/2022 and after going home started having abdominal pain with abdominal distention and therefore came to the emergency room for further management.  CT scan showed wall thickening inflammation and extraluminal gas in the ascending colon near the hepatic flexure.  General surgeon following   Assessment and Plan:   Abdominal pain Severe sepsis secondary to peritonitis-present on admission  Patient met SIRS criteria on admissionwith Tmax of 103, pulse 120 and WBC 16.8.  CT scan of the abdomen showed findings of localized peritonitis in the setting of acute bowel perforation. Patient also had findings suggestive of end organ damage including hepatic dysfunction with bilirubin level of 2.3 and lactic acid 2.2. Abdominal CT scan with wall thickening inflammation and extraluminal gas in the ascending colon near the hepatic flexure.  General surgery on board we appreciate input Continue IV fluid resuscitation Continue antibiotics Surgeon advancing diet today We will hope for discharge tomorrow if patient able to tolerate a diet  Obstructive sleep apnea (adult)   CPAP per home settings.     Essential hypertension Patient takes amlodipine, hydralazine, losartan and metoprolol   DVT prophylaxis-continue heparin     GERD-follows up with GI as an outpatient   Morbid obesity-BMI 41.6 Counseled on weight loss when medically stable     Physical Exam: Constitutional:  General: He is  not in acute distress. HENT:  Head: Normocephalic and atraumatic.  Cardiovascular: No murmur Pulmonary:     Effort: Pulmonary effort is normal.  Abdominal:     General: Nontender musculoskeletal:     Right lower leg: No edema.     Left lower leg: No edema.  Skin:    General: Skin is warm.  Neurological:     General: No focal deficit present.     Mental Status: He is alert and oriented to person, place, and time.  Psychiatric:        Attention and Perception: Attention normal.         Data Reviewed: Have personally reviewed patient's laboratory results today  Potassium 3.1 sodium 133, CT scan results of the abdomen reviewed by me as well with no acute findings   Family Communication: None present at bedside   Disposition: Status is: Inpatient Still continues to meet inpatient criteria given possible need for surgical intervention    Time spent: 40 minutes       Vitals:   06/12/22 1807 06/12/22 2036 06/13/22 0434 06/13/22 0753  BP: 139/82 133/78 126/72 122/73  Pulse: 93 91 87 82  Resp: 16 20 20 18   Temp: 98.2 F (36.8 C) 100 F (37.8 C) 98.4 F (36.9 C) 98.2 F (36.8 C)  TempSrc: Oral Oral Oral Oral  SpO2: 97% 94% 93% 97%  Weight:      Height:         Author: Loyce Dys, MD 06/13/2022 4:06 PM  For on call review [email or web address].     Patient admits to improvement in his abdominal pain and able to tolerate lunch and  dinner and therefore being discharged home today to follow-up with surgeon

## 2022-06-13 NOTE — Discharge Summary (Signed)
  Physician Discharge Summary   Patient: Trevor Castillo MRN: [id number] DOB: 1965-11-06  Admit date:     06/08/2022  Discharge date: 06/13/22  Discharge Physician: Loyce Dys   PCP: Tessie Fass, MD     Discharge Diagnoses: Abdominal pain Severe sepsis secondary to peritonitis-present on admission  Obstructive sleep apnea (adult)   Essential hypertension GERD Morbid obesity-BMI 41.6  Hospital Course: Trevor Castillo is an 57 y.o. male seen in ed today for adb pain. Pt had EGD/colonoscopy 06/08/2022 and after going home started having abdominal pain with abdominal distention and therefore came to the emergency room for further management.  CT scan showed wall thickening inflammation and extraluminal gas in the ascending colon near the hepatic flexure.  General surgeon saw patient and patient was managed conservatively with improvement and resolution of abdominal pain.  Repeat CT imaging did not show any worsening of abdominal findings therefore patient diet was advanced and have been cleared by surgeon for discharge today to follow-up as an outpatient.     Consultants: Surgery, gastroenterology Procedures performed: None Disposition: Home Diet recommendation:  Cardiac diet DISCHARGE MEDICATION: Allergies as of 06/13/2022       Reactions   Gramineae Pollens Other (See Comments)   Itchy, watery eyes and sneezing         Medication List     STOP taking these medications    ivabradine 7.5 MG Tabs tablet Commonly known as: Corlanor       TAKE these medications    acetaminophen 325 MG tablet Commonly known as: TYLENOL Take 2 tablets (650 mg total) by mouth every 6 (six) hours as needed for mild pain or moderate pain (or Fever >/= 101).   amLODipine 5 MG tablet Commonly known as: NORVASC Take 5 mg by mouth daily.   LORazepam 0.5 MG tablet Commonly known as: ATIVAN Take 1 tablet by mouth 2 (two) times daily as needed.   losartan-hydrochlorothiazide  100-12.5 MG tablet Commonly known as: HYZAAR Take 1 tablet by mouth daily.   metoprolol succinate 50 MG 24 hr tablet Commonly known as: TOPROL-XL Take 50 mg by mouth daily. Take with or immediately following a meal.   Misc. Devices Misc by Does not apply route. cpap device   Multi-Vitamin tablet Take 1 tablet by mouth daily.   naproxen sodium 220 MG tablet Commonly known as: ALEVE Take 220 mg by mouth.   omeprazole 20 MG capsule Commonly known as: PRILOSEC Take 20 mg by mouth daily.   rosuvastatin 10 MG tablet Commonly known as: CRESTOR Take 10 mg by mouth daily.        Discharge Exam: Filed Weights   06/08/22 2157  Weight: (!) 167.6 kg   Constitutional:  General: He is not in acute distress. HENT:  Head: Normocephalic and atraumatic.  Cardiovascular: No murmur Pulmonary:     Effort: Pulmonary effort is normal.  Abdominal:     General: Nontender musculoskeletal:     Right lower leg: No edema.     Left lower leg: No edema.  Skin:    General: Skin is warm.  Neurological:     General: No focal deficit present.     Mental Status: He is alert and oriented to person, place, and time.  Psychiatric:        Attention and Perception: Attention normal.   Condition at discharge: good  Discharge time spent: greater than 30 minutes.  Signed: Loyce Dys, MD [facility] 06/13/2022

## 2022-06-13 NOTE — Progress Notes (Signed)
[facility] SURGICAL PROGRESS NOTE (cpt [id number])  Hospital Day(s): 5.   Interval History: Patient seen and examined, no acute events or new complaints overnight. Patient reports he is doing much better; no significant abdominal pain this morning. No fever, chills, nausea, emesis. Remains without leukocytosis now for 24 hours; 9.5K. Hgb to 13.6. Renal function normal; sCr - 0.82; UO - unmeasured. Hypokalemia to 3.1. He is on CLD; tolerated. Having bowel function   Review of Systems:  Constitutional: denies fever, chills  HEENT: denies cough or congestion  Respiratory: denies any shortness of breath  Cardiovascular: denies chest pain or palpitations  Gastrointestinal: denied abdominal pain; denied N/V Genitourinary: denies burning with urination or urinary frequency Musculoskeletal: denies pain, decreased motor or sensation  Vital signs in last 24 hours: [min-max] current  Temp:  [98.2 F (36.8 C)-100 F (37.8 C)] 98.4 F (36.9 C) (04/29 0434) Pulse Rate:  [87-93] 87 (04/29 0434) Resp:  [16-20] 20 (04/29 0434) BP: (126-139)/(72-82) 126/72 (04/29 0434) SpO2:  [93 %-97 %] 93 % (04/29 0434)     Height: 6\' 7"  (200.7 cm) Weight: (!) 167.6 kg BMI (Calculated): 41.6   Intake/Output last 2 shifts:  04/28 0701 - 04/29 0700 In: 987.3 [P.O.:840; I.V.:147.3] Out: -    Physical Exam:  Constitutional: alert, cooperative and no distress  HENT: normocephalic without obvious abnormality  Eyes: PERRL, EOM's grossly intact and symmetric  Respiratory: breathing non-labored at rest  Cardiovascular: regular rate and sinus rhythm  Gastrointestinal: Obese, soft, non-tender this AM, difficult to ascertain distension given body habitus. He is certainly without overt peritonitis  Musculoskeletal: no edema or wounds, motor and sensation grossly intact, NT    Labs:     Latest Ref Rng & Units 06/13/2022    5:21 AM 06/12/2022    8:42 AM 06/10/2022    8:37 AM  CBC  WBC 4.0 - 10.5 K/uL 9.5   9.3  13.2   Hemoglobin 13.0 - 17.0 g/dL 11.9  14.7  82.9   Hematocrit 39.0 - 52.0 % 38.7  37.5  38.2   Platelets 150 - 400 K/uL 215  177  139       Latest Ref Rng & Units 06/13/2022    5:21 AM 06/12/2022    8:42 AM 06/10/2022    8:37 AM  CMP  Glucose 70 - 99 mg/dL 562  130  865   BUN 6 - 20 mg/dL 13  17  19    Creatinine 0.61 - 1.24 mg/dL 7.84  6.96  2.95   Sodium 135 - 145 mmol/L 133  133  135   Potassium 3.5 - 5.1 mmol/L 3.1  3.0  3.2   Chloride 98 - 111 mmol/L 98  102  104   CO2 22 - 32 mmol/L 26  22  23    Calcium 8.9 - 10.3 mg/dL 8.6  8.3  8.4      Imaging studies: No new pertinent imaging studies   Assessment/Plan: (ICD-10's: K63.1) 57 y.o. male with clinically improving iatrogenic colon perforation secondary to colonoscopy (04/24)   - Plan for repeat CT Abdomen/Pelvis this AM  - No need for emergent surgical intervention  - Continue CLD for now. Pending CT, should be able to advance diet              - IV Abx (Zosyn); would complete 7 days total (IV + PO)             - Monitor abdominal examination             -  Pain control prn; antiemetics prn              - Mobilize as tolerated              - Monitor leukocytosis; resolved x48 hours              - Further management per primary service  - Discharge Planning: If CT is reassuring, can likely advance diet today and DC this PM vs tomorrow morning.   All of the above findings and recommendations were discussed with the patient, and all of patient's questions were answered to his expressed satisfaction.  -- Lynden Oxford, PA-C [facility] 06/13/2022, 7:33 AM M-F: 7am - 4pm

## 2022-06-13 NOTE — Progress Notes (Signed)
[name] brief Courtesy Note  Patient seen pre-discharge. Patient tolerated solid food for lunch today and is being discharged after clearance by surgical service.Patient with minimal to no discomfort. Discharge planning physician Dr. Meriam Sprague to give po antibiotics at discharge after consulting with surgery, per RN Norberto Sorenson). Pain medication requested by patient but not yet approved.  Vitals:   06/13/22 0753 06/13/22 1641  BP: 122/73 127/74  Pulse: 82 98  Resp: 18 (!) 21  Temp: 98.2 F (36.8 C) 99.3 F (37.4 C)  SpO2: 97% 96%       Labs:    Latest Ref Rng & Units 06/13/2022    5:21 AM 06/12/2022    8:42 AM 06/10/2022    8:37 AM  CBC  WBC 4.0 - 10.5 K/uL 9.5  9.3  13.2   Hemoglobin 13.0 - 17.0 g/dL 16.1  09.6  04.5   Hematocrit 39.0 - 52.0 % 38.7  37.5  38.2   Platelets 150 - 400 K/uL 215  177  139     CMP     Component Value Date/Time   NA 133 (L) 06/13/2022 0521   K 3.1 (L) 06/13/2022 0521   CL 98 06/13/2022 0521   CO2 26 06/13/2022 0521   GLUCOSE 114 (H) 06/13/2022 0521   BUN 13 06/13/2022 0521   CREATININE 0.82 06/13/2022 0521   CALCIUM 8.6 (L) 06/13/2022 0521   PROT 6.4 (L) 06/09/2022 0545   ALBUMIN 3.3 (L) 06/09/2022 0545   AST 25 06/09/2022 0545   ALT 34 06/09/2022 0545   ALKPHOS 36 (L) 06/09/2022 0545   BILITOT 2.3 (H) 06/09/2022 0545   GFRNONAA >60 06/13/2022 4098      Impression:  Iatrogenic colon perforation, contained - location ascending colon near hepatic flexure. Symptomatically much improved. No free air on follow up CT 2.   OSA 3.   Hypertension 4.   Tubular adenoma of colon - 06/08/2022 colonoscopy     Plan:  Discharge per Attending physician on antibiotics and possible pain control. 2.   Will follow up in my office. 3.   Appreciate Dr. Claudine Mouton and Laqueta Due, PA-C help with surgical consultation and support.    Thank you  T. Bing Plume, M.D. ABIM Diplomate in Gastroenterology [facility] [phone] - Cell

## 2022-06-13 NOTE — Progress Notes (Signed)
Patient notified of discharge orders. Discharge materials and education provided, all questions and concerns addressed at this time. Iv removed with no complications. Patient escorted off unit by member of unit staff

## 2022-06-14 ENCOUNTER — Encounter: Payer: Self-pay | Admitting: Internal Medicine

## 2022-06-15 ENCOUNTER — Ambulatory Visit: Payer: BC Managed Care – PPO

## 2022-06-15 DIAGNOSIS — G4733 Obstructive sleep apnea (adult) (pediatric): Secondary | ICD-10-CM | POA: Diagnosis not present

## 2022-06-15 NOTE — Progress Notes (Signed)
95 percentile pressure 13.4   95th percentile leak 4.9   apnea index 1.9 /hr  apnea-hypopnea index  2.0 /hr   total days used  >4 hr 90 days  total days used <4 hr 0 days  Total compliance 100 percent  He is doing great no problems or questions at this time. Pt was seen by Tresa Endo  RRT/RCP  from [facility]

## 2022-06-21 ENCOUNTER — Encounter: Payer: Self-pay | Admitting: Internal Medicine

## 2022-06-29 DIAGNOSIS — I10 Essential (primary) hypertension: Secondary | ICD-10-CM | POA: Diagnosis not present

## 2022-06-29 DIAGNOSIS — K631 Perforation of intestine (nontraumatic): Secondary | ICD-10-CM | POA: Diagnosis not present

## 2022-06-30 ENCOUNTER — Encounter: Payer: Self-pay | Admitting: Nurse Practitioner

## 2022-06-30 ENCOUNTER — Ambulatory Visit: Payer: BC Managed Care – PPO | Admitting: Nurse Practitioner

## 2022-06-30 VITALS — BP 150/80 | HR 97 | Temp 98.1°F | Resp 16 | Ht 79.0 in | Wt 362.0 lb

## 2022-06-30 DIAGNOSIS — Z7189 Other specified counseling: Secondary | ICD-10-CM | POA: Diagnosis not present

## 2022-06-30 DIAGNOSIS — G4733 Obstructive sleep apnea (adult) (pediatric): Secondary | ICD-10-CM

## 2022-06-30 DIAGNOSIS — Z6841 Body Mass Index (BMI) 40.0 and over, adult: Secondary | ICD-10-CM

## 2022-06-30 NOTE — Progress Notes (Signed)
[facility] [address] [place], [place] [address]  Internal MEDICINE  Office Visit Note  Patient Name: Trevor Castillo  [id number]  [id number]  Date of Service: 06/30/2022  Chief Complaint  Patient presents with   Follow-up    HPI Marsel presents for a follow-up visit for OSA on CPAP Doing well, no complaints or concerns. Uses CPAP every night, wakes up rested, denies any morning headaches, daytime sleepiness or issues with napping.   CPAP download 95 percentile pressure 13.4 95th percentile leak 4.9 apnea index 1.9 /hr apnea-hypopnea index  2.0 /hr total days used  >4 hr 90 days total days used <4 hr 0 days Total compliance 100 percent   Pt was seen by Tresa Endo  RRT/RCP  from [facility]  EPWORTH SLEEPINESS SCALE: Scale: (0)= no chance of dozing; (1)= slight chance of dozing; (2)= moderate chance of dozing; (3)= high chance of dozing Chance  Situtation Sitting and reading: 0 Watching TV: 0 Sitting Inactive in public: 0 As a passenger in car: 0   Lying down to rest: 0 Sitting and talking: 0 Sitting quielty after lunch: 0 In a car, stopped in traffic: 0 TOTAL SCORE:   0 out of 24    Current Medication: Outpatient Encounter Medications as of 06/30/2022  Medication Sig   acetaminophen (TYLENOL) 325 MG tablet Take 2 tablets (650 mg total) by mouth every 6 (six) hours as needed for mild pain or moderate pain (or Fever >/= 101).   amLODipine (NORVASC) 5 MG tablet Take 5 mg by mouth daily.   LORazepam (ATIVAN) 0.5 MG tablet Take 1 tablet by mouth 2 (two) times daily as needed.   losartan-hydrochlorothiazide (HYZAAR) 100-12.5 MG tablet Take 1 tablet by mouth daily.   metoprolol succinate (TOPROL-XL) 50 MG 24 hr tablet Take 50 mg by mouth daily. Take with or immediately following a meal.   Misc. Devices MISC by Does not apply route. cpap device   Multiple Vitamin (MULTI-VITAMIN) tablet Take 1 tablet by mouth daily.   naproxen sodium (ALEVE) 220 MG tablet Take 220 mg by  mouth.   omeprazole (PRILOSEC) 20 MG capsule Take 20 mg by mouth daily.   rosuvastatin (CRESTOR) 10 MG tablet Take 10 mg by mouth daily.   No facility-administered encounter medications on file as of 06/30/2022.    Surgical History: Past Surgical History:  Procedure Laterality Date   ANKLE SURGERY Right    COLONOSCOPY N/A 06/08/2022   Procedure: COLONOSCOPY;  Surgeon: Toledo, Boykin Nearing, MD;  Location: [facility] ENDOSCOPY;  Service: Gastroenterology;  Laterality: N/A;   ESOPHAGOGASTRODUODENOSCOPY N/A 06/08/2022   Procedure: ESOPHAGOGASTRODUODENOSCOPY (EGD);  Surgeon: Toledo, Boykin Nearing, MD;  Location: [facility] ENDOSCOPY;  Service: Gastroenterology;  Laterality: N/A;   HIP SURGERY     JOINT REPLACEMENT      Medical History: Past Medical History:  Diagnosis Date   Diverticulitis    Hypertension    Sleep apnea     Family History: Family History  Problem Relation Age of Onset   Diabetes Mother    Kidney disease Father    Rheum arthritis Sister    Heart attack Brother     Social History   Socioeconomic History   Marital status: Married    Spouse name: Not on file   Number of children: Not on file   Years of education: Not on file   Highest education level: Not on file  Occupational History   Not on file  Tobacco Use   Smoking status: Former  Types: Cigarettes    Quit date: 05/15/2018    Years since quitting: 4.1   Smokeless tobacco: Former    Types: Associate Professor Use: Every day   Substances: Nicotine, Flavoring  Substance and Sexual Activity   Alcohol use: Yes    Alcohol/week: 14.0 standard drinks of alcohol    Types: 14 Shots of liquor per week   Drug use: No   Sexual activity: Not on file  Other Topics Concern   Not on file  Social History Narrative   Not on file   Social Determinants of Health   Financial Resource Strain: Not on file  Food Insecurity: No Food Insecurity (06/09/2022)   Hunger Vital Sign    Worried About Running Out of Food in the  Last Year: Never true    Ran Out of Food in the Last Year: Never true  Transportation Needs: No Transportation Needs (06/09/2022)   PRAPARE - Administrator, Civil Service (Medical): No    Lack of Transportation (Non-Medical): No  Physical Activity: Not on file  Stress: Not on file  Social Connections: Not on file  Intimate Partner Violence: Not At Risk (06/09/2022)   Humiliation, Afraid, Rape, and Kick questionnaire    Fear of Current or Ex-Partner: No    Emotionally Abused: No    Physically Abused: No    Sexually Abused: No      Review of Systems  Constitutional:  Negative for chills, fatigue and unexpected weight change.  HENT:  Negative for congestion, rhinorrhea, sneezing and sore throat.   Eyes:  Negative for redness.  Respiratory:  Negative for cough, chest tightness and shortness of breath.   Cardiovascular:  Negative for chest pain and palpitations.  Gastrointestinal:  Negative for abdominal pain, constipation, diarrhea, nausea and vomiting.  Genitourinary:  Negative for dysuria and frequency.  Musculoskeletal:  Negative for arthralgias, back pain, joint swelling and neck pain.  Skin:  Negative for rash.  Neurological: Negative.  Negative for tremors and numbness.  Hematological:  Negative for adenopathy. Does not bruise/bleed easily.  Psychiatric/Behavioral:  Negative for behavioral problems (Depression), sleep disturbance and suicidal ideas. The patient is not nervous/anxious.     Vital Signs: BP (!) 150/80   Pulse 97   Temp 98.1 F (36.7 C)   Resp 16   Ht 6\' 7"  (2.007 m)   Wt (!) 362 lb (164.2 kg)   SpO2 97%   BMI 40.78 kg/m    Physical Exam Vitals reviewed.  Constitutional:      General: He is not in acute distress.    Appearance: Normal appearance. He is obese. He is not ill-appearing.  HENT:     Head: Normocephalic and atraumatic.  Eyes:     Extraocular Movements: Extraocular movements intact.     Pupils: Pupils are equal, round, and  reactive to light.  Cardiovascular:     Rate and Rhythm: Normal rate and regular rhythm.     Heart sounds: Normal heart sounds. No murmur heard. Pulmonary:     Effort: Pulmonary effort is normal. No respiratory distress.     Breath sounds: Normal breath sounds. No wheezing.  Neurological:     Mental Status: He is alert and oriented to person, place, and time.     Cranial Nerves: No cranial nerve deficit.     Coordination: Coordination normal.     Gait: Gait normal.  Psychiatric:        Mood and Affect: Mood normal.  Behavior: Behavior normal.        Assessment/Plan: 1. OSA on CPAP Continue to use CPAP machine as instructed.   2. CPAP use counseling He does not need supplies at this time. Uses [facility]. He has no questions or concerns regaridng CPAP use or maintenance.   3. Class 3 severe obesity due to excess calories with serious comorbidity and body mass index (BMI) of 40.0 to 44.9 in adult [facility]) Significantly high BMI, patient is aware that weight loss may improve his sleep apnea and diet and lifestyle modifications have been discussed with him previously.    General Counseling: jaedyn demello understanding of the findings of todays visit and agrees with plan of treatment. I have discussed any further diagnostic evaluation that may be needed or ordered today. We also reviewed his medications today. he has been encouraged to call the office with any questions or concerns that should arise related to todays visit.    No orders of the defined types were placed in this encounter.   No orders of the defined types were placed in this encounter.   Return in about 1 year (around 06/30/2023) for F/U, pulmonary/sleep with DSK or Eylin Pontarelli.   Total time spent:30 Minutes Time spent includes review of chart, medications, test results, and follow up plan with the patient.   [place] Controlled Substance Database was reviewed by me.  This patient was seen by Sallyanne Kuster, FNP-C in  collaboration with Dr. Beverely Risen as a part of collaborative care agreement.   Meosha Castanon R. Tedd Sias, MSN, FNP-C Internal medicine

## 2022-09-15 DIAGNOSIS — G4733 Obstructive sleep apnea (adult) (pediatric): Secondary | ICD-10-CM | POA: Diagnosis not present

## 2022-10-10 DIAGNOSIS — J019 Acute sinusitis, unspecified: Secondary | ICD-10-CM | POA: Diagnosis not present

## 2022-10-10 DIAGNOSIS — Z03818 Encounter for observation for suspected exposure to other biological agents ruled out: Secondary | ICD-10-CM | POA: Diagnosis not present

## 2022-11-09 DIAGNOSIS — K631 Perforation of intestine (nontraumatic): Secondary | ICD-10-CM | POA: Diagnosis not present

## 2022-11-09 DIAGNOSIS — Z1331 Encounter for screening for depression: Secondary | ICD-10-CM | POA: Diagnosis not present

## 2022-11-09 DIAGNOSIS — I10 Essential (primary) hypertension: Secondary | ICD-10-CM | POA: Diagnosis not present

## 2022-11-09 DIAGNOSIS — E876 Hypokalemia: Secondary | ICD-10-CM | POA: Diagnosis not present

## 2022-11-09 DIAGNOSIS — Z0000 Encounter for general adult medical examination without abnormal findings: Secondary | ICD-10-CM | POA: Diagnosis not present

## 2022-11-09 DIAGNOSIS — R739 Hyperglycemia, unspecified: Secondary | ICD-10-CM | POA: Diagnosis not present

## 2022-11-09 DIAGNOSIS — Z125 Encounter for screening for malignant neoplasm of prostate: Secondary | ICD-10-CM | POA: Diagnosis not present

## 2022-11-23 DIAGNOSIS — I2089 Other forms of angina pectoris: Secondary | ICD-10-CM | POA: Diagnosis not present

## 2022-11-23 DIAGNOSIS — R002 Palpitations: Secondary | ICD-10-CM | POA: Diagnosis not present

## 2022-11-23 DIAGNOSIS — R0602 Shortness of breath: Secondary | ICD-10-CM | POA: Diagnosis not present

## 2022-11-23 DIAGNOSIS — R000 Tachycardia, unspecified: Secondary | ICD-10-CM | POA: Diagnosis not present

## 2022-11-29 DIAGNOSIS — H3563 Retinal hemorrhage, bilateral: Secondary | ICD-10-CM | POA: Diagnosis not present

## 2022-11-29 DIAGNOSIS — H52223 Regular astigmatism, bilateral: Secondary | ICD-10-CM | POA: Diagnosis not present

## 2022-11-29 DIAGNOSIS — H524 Presbyopia: Secondary | ICD-10-CM | POA: Diagnosis not present

## 2022-11-29 DIAGNOSIS — H5203 Hypermetropia, bilateral: Secondary | ICD-10-CM | POA: Diagnosis not present

## 2022-11-30 ENCOUNTER — Other Ambulatory Visit: Payer: Self-pay | Admitting: Internal Medicine

## 2022-11-30 DIAGNOSIS — I2089 Other forms of angina pectoris: Secondary | ICD-10-CM

## 2022-12-15 ENCOUNTER — Ambulatory Visit
Admission: RE | Admit: 2022-12-15 | Discharge: 2022-12-15 | Disposition: A | Discharge status: Home / Self Care | Payer: BC Managed Care – PPO | Source: Ambulatory Visit | Attending: Internal Medicine | Admitting: Internal Medicine

## 2022-12-15 DIAGNOSIS — I2089 Other forms of angina pectoris: Secondary | ICD-10-CM | POA: Diagnosis not present

## 2022-12-15 DIAGNOSIS — I200 Unstable angina: Secondary | ICD-10-CM | POA: Diagnosis not present

## 2022-12-15 MED ORDER — TECHNETIUM TC 99M TETROFOSMIN IV KIT
30.0000 | PACK | Freq: Once | INTRAVENOUS | Status: AC | PRN
  Administered 2022-12-15: 27.89 via INTRAVENOUS

## 2022-12-16 ENCOUNTER — Encounter
Admission: RE | Admit: 2022-12-16 | Discharge: 2022-12-16 | Disposition: A | Discharge status: Home / Self Care | Payer: BC Managed Care – PPO | Source: Ambulatory Visit | Attending: Internal Medicine | Admitting: Internal Medicine

## 2022-12-16 MED ORDER — TECHNETIUM TC 99M TETROFOSMIN IV KIT
30.0000 | PACK | Freq: Once | INTRAVENOUS | Status: AC | PRN
  Administered 2022-12-16: 32.86 via INTRAVENOUS

## 2022-12-20 DIAGNOSIS — R0602 Shortness of breath: Secondary | ICD-10-CM | POA: Diagnosis not present

## 2022-12-20 DIAGNOSIS — R002 Palpitations: Secondary | ICD-10-CM | POA: Diagnosis not present

## 2022-12-20 DIAGNOSIS — R000 Tachycardia, unspecified: Secondary | ICD-10-CM | POA: Diagnosis not present

## 2022-12-20 DIAGNOSIS — I2089 Other forms of angina pectoris: Secondary | ICD-10-CM | POA: Diagnosis not present

## 2022-12-20 LAB — NM MYOCAR MULTI W/SPECT W/WALL MOTION / EF
Angina Index: 0
Base ST Depression (mm): 0 mm
Duke Treadmill Score: 3
Estimated workload: 4.6
Exercise duration (min): 3 min
Exercise duration (sec): 0 s
LV dias vol: 107 mL (ref 62–150)
LV sys vol: 39 mL
Nuc Stress EF: 64 %
Peak HR: 169 {beats}/min
Percent HR: 104 %
Rest Nuclear Isotope Dose: 27.9 mCi
SDS: 0
SRS: 0
SSS: 1
ST Depression (mm): 0 mm
Stress Nuclear Isotope Dose: 32.9 mCi
TID: 0.87

## 2023-03-02 DIAGNOSIS — H318 Other specified disorders of choroid: Secondary | ICD-10-CM | POA: Diagnosis not present

## 2023-03-02 DIAGNOSIS — H3563 Retinal hemorrhage, bilateral: Secondary | ICD-10-CM | POA: Diagnosis not present

## 2023-05-17 DIAGNOSIS — M25551 Pain in right hip: Secondary | ICD-10-CM | POA: Diagnosis not present

## 2023-05-17 DIAGNOSIS — I10 Essential (primary) hypertension: Secondary | ICD-10-CM | POA: Diagnosis not present

## 2023-05-17 DIAGNOSIS — M25552 Pain in left hip: Secondary | ICD-10-CM | POA: Diagnosis not present

## 2023-05-25 DIAGNOSIS — H3563 Retinal hemorrhage, bilateral: Secondary | ICD-10-CM | POA: Diagnosis not present

## 2023-05-25 DIAGNOSIS — H318 Other specified disorders of choroid: Secondary | ICD-10-CM | POA: Diagnosis not present

## 2023-06-21 ENCOUNTER — Ambulatory Visit: Payer: BC Managed Care – PPO

## 2023-06-21 DIAGNOSIS — I2510 Atherosclerotic heart disease of native coronary artery without angina pectoris: Secondary | ICD-10-CM | POA: Diagnosis not present

## 2023-06-21 DIAGNOSIS — I10 Essential (primary) hypertension: Secondary | ICD-10-CM | POA: Diagnosis not present

## 2023-06-21 DIAGNOSIS — R002 Palpitations: Secondary | ICD-10-CM | POA: Diagnosis not present

## 2023-06-21 DIAGNOSIS — E785 Hyperlipidemia, unspecified: Secondary | ICD-10-CM | POA: Diagnosis not present

## 2023-06-29 ENCOUNTER — Ambulatory Visit: Payer: BC Managed Care – PPO | Admitting: Nurse Practitioner

## 2023-06-29 ENCOUNTER — Encounter: Payer: Self-pay | Admitting: Nurse Practitioner

## 2023-06-29 VITALS — BP 137/87 | HR 98 | Temp 97.4°F | Resp 16 | Ht 79.0 in | Wt 380.8 lb

## 2023-06-29 DIAGNOSIS — G4733 Obstructive sleep apnea (adult) (pediatric): Secondary | ICD-10-CM

## 2023-06-29 DIAGNOSIS — Z6841 Body Mass Index (BMI) 40.0 and over, adult: Secondary | ICD-10-CM

## 2023-06-29 DIAGNOSIS — Z7189 Other specified counseling: Secondary | ICD-10-CM

## 2023-06-29 DIAGNOSIS — E66813 Obesity, class 3: Secondary | ICD-10-CM | POA: Diagnosis not present

## 2023-06-29 NOTE — Progress Notes (Signed)
 [facility] [address] [place], [place] [address]  Internal MEDICINE  Office Visit Note  Patient Name: Trevor Castillo  [id number]  [id number]  Date of Service: 06/29/2023  Chief Complaint  Patient presents with   Follow-up    HPI Trevor Castillo presents for a follow-up visit for OSA, CPAP, obesity. Doing well, no complaints or concerns. Uses CPAP every night, wakes up rested, denies any morning headaches, daytime sleepiness or issues with napping. Does not need any supplies at this time. No change in epworth sleepiness scale score    CPAP download Min pressure 5 cmH2O Max pressure 15 cmH2O 95 percentile pressure 14.0 cmH2O 95th percentile leak 8.8 LPM apnea index 2.1 events/hr Hypopnea index 0.1 events/hr apnea-hypopnea index  2.1 events/hr total days used  >4 hr 30 days total days used <4 hr 0 days Average usage per night 8 hours 49 min Total compliance 100 percent   EPWORTH SLEEPINESS SCALE: Scale: (0)= no chance of dozing; (1)= slight chance of dozing; (2)= moderate chance of dozing; (3)= high chance of dozing Chance  Situtation Sitting and reading: 0 Watching TV: 0 Sitting Inactive in public: 0 As a passenger in car: 0   Lying down to rest: 0 Sitting and talking: 0 Sitting quielty after lunch: 0 In a car, stopped in traffic: 0 TOTAL SCORE:   0 out of 24   Obesity -- has gained 18 lbs since visit last year in may. Discussed weight loss and encouraged patient to discuss with PCP Elevated blood pressure -- reports increased stress today, BP at home reviewed and was ok.    Current Medication: Outpatient Encounter Medications as of 06/29/2023  Medication Sig   acetaminophen  (TYLENOL ) 325 MG tablet Take 2 tablets (650 mg total) by mouth every 6 (six) hours as needed for mild pain or moderate pain (or Fever >/= 101).   amLODipine  (NORVASC ) 5 MG tablet Take 5 mg by mouth daily.   LORazepam  (ATIVAN ) 0.5 MG tablet Take 1 tablet by mouth 2 (two) times daily as  needed.   metoprolol  succinate (TOPROL -XL) 50 MG 24 hr tablet Take 50 mg by mouth daily. Take with or immediately following a meal.   Misc. Devices MISC by Does not apply route. cpap device   Multiple Vitamin (MULTI-VITAMIN) tablet Take 1 tablet by mouth daily.   naproxen sodium (ALEVE) 220 MG tablet Take 220 mg by mouth.   omeprazole (PRILOSEC) 20 MG capsule Take 20 mg by mouth daily.   rosuvastatin  (CRESTOR ) 10 MG tablet Take 10 mg by mouth daily.   losartan -hydrochlorothiazide  (HYZAAR) 100-12.5 MG tablet Take 1 tablet by mouth daily.   No facility-administered encounter medications on file as of 06/29/2023.    Surgical History: Past Surgical History:  Procedure Laterality Date   ANKLE SURGERY Right    COLONOSCOPY N/A 06/08/2022   Procedure: COLONOSCOPY;  Surgeon: Toledo, Alphonsus Jeans, MD;  Location: [facility] ENDOSCOPY;  Service: Gastroenterology;  Laterality: N/A;   ESOPHAGOGASTRODUODENOSCOPY N/A 06/08/2022   Procedure: ESOPHAGOGASTRODUODENOSCOPY (EGD);  Surgeon: Toledo, Alphonsus Jeans, MD;  Location: [facility] ENDOSCOPY;  Service: Gastroenterology;  Laterality: N/A;   HIP SURGERY     JOINT REPLACEMENT      Medical History: Past Medical History:  Diagnosis Date   Diverticulitis    Hypertension    Sleep apnea     Family History: Family History  Problem Relation Age of Onset   Diabetes Mother    Kidney disease Father    Rheum arthritis Sister    Heart attack  Brother     Social History   Socioeconomic History   Marital status: Married    Spouse name: Not on file   Number of children: Not on file   Years of education: Not on file   Highest education level: Not on file  Occupational History   Not on file  Tobacco Use   Smoking status: Former    Current packs/day: 0.00    Types: Cigarettes    Quit date: 05/15/2018    Years since quitting: 5.1   Smokeless tobacco: Former    Types: Engineer, drilling   Vaping status: Every Day   Substances: Nicotine, Flavoring  Substance and Sexual  Activity   Alcohol use: Yes    Alcohol/week: 14.0 standard drinks of alcohol    Types: 14 Shots of liquor per week   Drug use: No   Sexual activity: Not on file  Other Topics Concern   Not on file  Social History Narrative   Not on file   Social Drivers of Health   Financial Resource Strain: Low Risk  (11/04/2022)   Received from [facility]   Overall Financial Resource Strain (CARDIA)    Difficulty of Paying Living Expenses: Not hard at all  Food Insecurity: No Food Insecurity (11/04/2022)   Received from [facility]   Hunger Vital Sign    Worried About Running Out of Food in the Last Year: Never true    Ran Out of Food in the Last Year: Never true  Transportation Needs: No Transportation Needs (11/04/2022)   Received from [facility] - Transportation    In the past 12 months, has lack of transportation kept you from medical appointments or from getting medications?: No    Lack of Transportation (Non-Medical): No  Physical Activity: Patient Declined (08/12/2020)   Received from [facility], [facility]   Exercise Vital Sign    Days of Exercise per Week: Patient declined    Minutes of Exercise per Session: Patient declined  Stress: Patient Declined (08/12/2020)   Received from [facility], [facility]   [employer] of Occupational Health - Occupational Stress Questionnaire    Feeling of Stress : Patient declined  Social Connections: Unknown (08/12/2020)   Received from [facility], [facility]   Social Connection and Isolation Panel [NHANES]    Frequency of Communication with Friends and Family: Patient declined    Frequency of Social Gatherings with Friends and Family: Patient declined    Attends Religious Services: Patient declined    Active Member of Clubs or Organizations: Patient declined    Attends  Banker Meetings: Patient declined    Marital Status: Married  Catering manager Violence: Not At Risk (06/09/2022)   Humiliation, Afraid, Rape, and Kick questionnaire    Fear of Current or Ex-Partner: No    Emotionally Abused: No    Physically Abused: No    Sexually Abused: No      Review of Systems  Constitutional:  Negative for chills, fatigue and unexpected weight change.  HENT:  Negative for congestion, rhinorrhea, sneezing and sore throat.   Eyes:  Negative for redness.  Respiratory:  Negative for cough, chest tightness and shortness of breath.   Cardiovascular:  Negative for chest pain and palpitations.  Gastrointestinal:  Negative for abdominal pain, constipation, diarrhea, nausea and vomiting.  Genitourinary:  Negative for dysuria and frequency.  Musculoskeletal:  Negative for arthralgias, back pain, joint swelling and neck pain.  Skin:  Negative for rash.  Neurological: Negative.  Negative for tremors and numbness.  Hematological:  Negative for adenopathy. Does not bruise/bleed easily.  Psychiatric/Behavioral:  Negative for behavioral problems (Depression), sleep disturbance and suicidal ideas. The patient is not nervous/anxious.     Vital Signs: BP 137/87 Comment: 160/84  Pulse 98   Temp (!) 97.4 F (36.3 C)   Resp 16   Ht 6\' 7"  (2.007 m)   Wt (!) 380 lb 12.8 oz (172.7 kg)   SpO2 95%   BMI 42.90 kg/m    Physical Exam Vitals reviewed.  Constitutional:      General: He is not in acute distress.    Appearance: Normal appearance. He is obese. He is not ill-appearing.  HENT:     Head: Normocephalic and atraumatic.  Eyes:     Extraocular Movements: Extraocular movements intact.     Pupils: Pupils are equal, round, and reactive to light.  Cardiovascular:     Rate and Rhythm: Normal rate and regular rhythm.     Heart sounds: Normal heart sounds. No murmur heard. Pulmonary:     Effort: Pulmonary effort is normal. No respiratory distress.     Breath  sounds: Normal breath sounds. No wheezing.  Neurological:     Mental Status: He is alert and oriented to person, place, and time.     Cranial Nerves: No cranial nerve deficit.     Coordination: Coordination normal.     Gait: Gait normal.  Psychiatric:        Mood and Affect: Mood normal.        Behavior: Behavior normal.        Assessment/Plan: 1. OSA on CPAP (Primary) Continue CPAP use as instructed. Follow up in 1 year.   2. Class 3 severe obesity due to excess calories with serious comorbidity and body mass index (BMI) of 40.0 to 44.9 in adult Encouraged weight loss with patient, he will discuss with his PCP  3. CPAP use counseling Continue CPAP use and maintenance as instructed.    General Counseling: gracin mcpartland understanding of the findings of todays visit and agrees with plan of treatment. I have discussed any further diagnostic evaluation that may be needed or ordered today. We also reviewed his medications today. he has been encouraged to call the office with any questions or concerns that should arise related to todays visit.    No orders of the defined types were placed in this encounter.   No orders of the defined types were placed in this encounter.   Return in about 1 year (around 06/28/2024) for F/U, pulmonary/sleep, Cason Luffman and DSK.   Total time spent:30 Minutes Time spent includes review of chart, medications, test results, and follow up plan with the patient.   [place] Controlled Substance Database was reviewed by me.  This patient was seen by Laurence Pons, FNP-C in collaboration with Dr. Verneta Gone as a part of collaborative care agreement.   Rozlynn Lippold R. Bobbi Burow, MSN, FNP-C Internal medicine

## 2023-07-15 ENCOUNTER — Encounter: Payer: Self-pay | Admitting: Nurse Practitioner

## 2024-02-14 ENCOUNTER — Ambulatory Visit: Payer: Self-pay | Admitting: Medical

## 2024-02-14 ENCOUNTER — Encounter: Payer: Self-pay | Admitting: Medical

## 2024-02-14 ENCOUNTER — Other Ambulatory Visit: Payer: Self-pay

## 2024-02-14 VITALS — BP 134/82 | HR 102 | Temp 98.5°F | Ht 79.0 in | Wt 375.0 lb

## 2024-02-14 DIAGNOSIS — J029 Acute pharyngitis, unspecified: Secondary | ICD-10-CM

## 2024-02-14 LAB — POCT RAPID STREP A (OFFICE): Rapid Strep A Screen: NEGATIVE

## 2024-02-14 NOTE — Progress Notes (Signed)
 [facility] [address] [place], [place] [address]   Office Visit Note  Patient Name: Trevor Castillo Date of Birth [id number]  Medical Record number [id number]  Date of Service: 02/14/2024  Chief Complaint  Patient presents with   Sore Throat    PND/Sore throat x 2-3 days.      HPI Discussed the use of AI scribe software for clinical note transcription with the patient, who gave verbal consent to proceed.  History of Present Illness Trevor Castillo is a 58 year old male who presents with a sore throat.  Pharyngitis symptoms - Sore throat onset 2-3 days ago - Characterized by dryness and pain, worse at night - Intermittent soreness - White discoloration resembling mucus in the posterior pharynx noted last night - No fever or chills - No cough - No headaches - No unusual fatigue  Nasal and upper respiratory symptoms - Slightly stuffy nose - No significant nasal discharge  Gastrointestinal and constitutional symptoms - No nausea, vomiting, or diarrhea - No unusual body aches   Exposure history - No known exposure to individuals with strep or influenza - Recent contact with family, including children attending daycare, during the holidays  Self-care and medications - No medications taken for sore throat except for cough drops - Did not receive influenza vaccination this year - History of hypertension; avoids over-the-counter cold medications   Current Medication:  Outpatient Encounter Medications as of 02/14/2024  Medication Sig   acetaminophen  (TYLENOL ) 325 MG tablet Take 2 tablets (650 mg total) by mouth every 6 (six) hours as needed for mild pain or moderate pain (or Fever >/= 101).   amLODipine  (NORVASC ) 5 MG tablet Take 5 mg by mouth daily.   LORazepam  (ATIVAN ) 0.5 MG tablet Take 1 tablet by mouth 2 (two) times daily as needed.   losartan -hydrochlorothiazide  (HYZAAR) 100-12.5 MG tablet Take 1 tablet by mouth daily.   metoprolol   succinate (TOPROL -XL) 50 MG 24 hr tablet Take 50 mg by mouth daily. Take with or immediately following a meal.   Misc. Devices MISC by Does not apply route. cpap device   Multiple Vitamin (MULTI-VITAMIN) tablet Take 1 tablet by mouth daily.   naproxen sodium (ALEVE) 220 MG tablet Take 220 mg by mouth.   omeprazole (PRILOSEC) 20 MG capsule Take 20 mg by mouth daily.   rosuvastatin  (CRESTOR ) 10 MG tablet Take 10 mg by mouth daily.   No facility-administered encounter medications on file as of 02/14/2024.      Medical History: Past Medical History:  Diagnosis Date   Diverticulitis    Hypertension    Sleep apnea      Vital Signs: BP 134/82   Pulse (!) 102   Temp 98.5 F (36.9 C)   Ht 6' 7 (2.007 m)   Wt (!) 375 lb (170.1 kg)   SpO2 97%   BMI 42.25 kg/m    Review of Systems See HPI  Physical Exam Vitals reviewed.  Constitutional:      General: He is not in acute distress.    Appearance: He is not ill-appearing.  HENT:     Head: Normocephalic.     Right Ear: Tympanic membrane, ear canal and external ear normal.     Left Ear: Tympanic membrane, ear canal and external ear normal.     Nose: Mucosal edema and congestion (mild) present. No rhinorrhea.     Mouth/Throat:     Mouth: Mucous membranes are moist. No oral lesions.  Pharynx: Uvula midline. Posterior oropharyngeal erythema (mild) present. No pharyngeal swelling, oropharyngeal exudate or uvula swelling.     Tonsils: No tonsillar exudate.  Cardiovascular:     Rate and Rhythm: Normal rate and regular rhythm.     Heart sounds: No murmur heard.    No friction rub. No gallop.  Pulmonary:     Effort: Pulmonary effort is normal.     Breath sounds: Normal breath sounds. No wheezing, rhonchi or rales.  Musculoskeletal:     Cervical back: Neck supple. No rigidity.  Lymphadenopathy:     Cervical: No cervical adenopathy.  Neurological:     Mental Status: He is alert.     Recent Results (from the past 2160 hours)   POCT rapid strep A     Status: Normal   Collection Time: 02/14/24 11:00 AM  Result Value Ref Range   Rapid Strep A Screen Negative Negative     Assessment/Plan: 1. Pharyngitis, unspecified etiology (Primary)  - POCT rapid strep A   Assessment & Plan Acute pharyngitis Negative strep test. Suspect early cold virus. Low suspicion for bacterial infection or influenza. Did offer POC influenza test given local flu activity, patient declined. - Advised rest and increased fluid intake. - Recommended Tylenol  for pain management. - Suggested lozenges, tea, and gargling with warm salt water for symptomatic relief. - Advised against over-the-counter decongestants due to hypertension. May try Coricidin products. - Offered flu test and follow up if symptoms worsen or new symptoms develop.   Patient Instructions  -Rest and stay well hydrated (by drinking water and other liquids).  -Take over-the-counter medicines (i.e. Tylenol  or Coricidin cold medicine, Nyquil) to help relieve your symptoms. -For your sore throat, use cough drops/throat lozenges, gargle warm salt water and/or drink warm liquids (like tea with honey). -Send MyChart message to provider, call or schedule return visit as needed for new/worsening symptoms (i.e. fever, increased throat pain/difficulty swallowing) or if symptoms do not improve as discussed with recommended treatment over the next 7 days.   General Counseling: lydia meng understanding of the findings of todays visit and plan of treatment. he has been encouraged to call the office with any questions or concerns that should arise related to todays visit.    Joen Arts PA-C Physician Assistant

## 2024-02-18 NOTE — Patient Instructions (Signed)
-  Rest and stay well hydrated (by drinking water and other liquids).  -Take over-the-counter medicines (i.e. Tylenol  or Coricidin cold medicine, Nyquil) to help relieve your symptoms. -For your sore throat, use cough drops/throat lozenges, gargle warm salt water and/or drink warm liquids (like tea with honey). -Send MyChart message to provider, call or schedule return visit as needed for new/worsening symptoms (i.e. fever, increased throat pain/difficulty swallowing) or if symptoms do not improve as discussed with recommended treatment over the next 7 days.

## 2024-07-02 ENCOUNTER — Ambulatory Visit: Admitting: Nurse Practitioner
